# Patient Record
Sex: Female | Born: 1960 | Race: Black or African American | Hispanic: No
Health system: Southern US, Community
[De-identification: ages and names within clinical notes are randomized; demographics above are authoritative.]

## PROBLEM LIST (undated history)

## (undated) DIAGNOSIS — I10 Essential (primary) hypertension: Secondary | ICD-10-CM

## (undated) DIAGNOSIS — Z87828 Personal history of other (healed) physical injury and trauma: Secondary | ICD-10-CM

## (undated) DIAGNOSIS — Z91018 Allergy to other foods: Secondary | ICD-10-CM

## (undated) HISTORY — DX: Personal history of other (healed) physical injury and trauma: Z87.828

## (undated) HISTORY — DX: Allergy to other foods: Z91.018

## (undated) HISTORY — DX: Essential (primary) hypertension: I10

---

## 1989-03-29 HISTORY — PX: TUBAL LIGATION: SHX77

## 1992-03-29 HISTORY — PX: BREAST SURGERY: SHX581

## 1998-12-08 ENCOUNTER — Other Ambulatory Visit: Admission: RE | Admit: 1998-12-08 | Discharge: 1998-12-08 | Payer: Self-pay | Admitting: Gynecology

## 2002-07-03 ENCOUNTER — Other Ambulatory Visit: Admission: RE | Admit: 2002-07-03 | Discharge: 2002-07-03 | Payer: Self-pay | Admitting: Gynecology

## 2003-07-29 ENCOUNTER — Other Ambulatory Visit: Admission: RE | Admit: 2003-07-29 | Discharge: 2003-07-29 | Payer: Self-pay | Admitting: Gynecology

## 2004-08-17 ENCOUNTER — Other Ambulatory Visit: Admission: RE | Admit: 2004-08-17 | Discharge: 2004-08-17 | Payer: Self-pay | Admitting: Gynecology

## 2007-02-07 ENCOUNTER — Ambulatory Visit: Payer: Self-pay | Admitting: Internal Medicine

## 2007-02-07 DIAGNOSIS — I10 Essential (primary) hypertension: Secondary | ICD-10-CM

## 2007-02-20 ENCOUNTER — Ambulatory Visit: Payer: Self-pay | Admitting: Internal Medicine

## 2007-02-20 LAB — CONVERTED CEMR LAB
AST: 37 units/L (ref 0–37)
Albumin: 3.6 g/dL (ref 3.5–5.2)
Alkaline Phosphatase: 90 units/L (ref 39–117)
BUN: 13 mg/dL (ref 6–23)
Basophils Relative: 0.5 % (ref 0.0–1.0)
Calcium: 9.3 mg/dL (ref 8.4–10.5)
Chloride: 98 meq/L (ref 96–112)
Cholesterol: 202 mg/dL (ref 0–200)
Creatinine, Ser: 0.9 mg/dL (ref 0.4–1.2)
Crystals: NEGATIVE
Hemoglobin: 12.5 g/dL (ref 12.0–15.0)
Monocytes Absolute: 0.6 10*3/uL (ref 0.2–0.7)
Monocytes Relative: 12.7 % — ABNORMAL HIGH (ref 3.0–11.0)
Nitrite: NEGATIVE
RDW: 12.4 % (ref 11.5–14.6)
Specific Gravity, Urine: 1.015 (ref 1.000–1.03)
Total Bilirubin: 0.6 mg/dL (ref 0.3–1.2)
Total CHOL/HDL Ratio: 6.8
VLDL: 21 mg/dL (ref 0–40)

## 2007-08-07 ENCOUNTER — Ambulatory Visit: Payer: Self-pay | Admitting: Internal Medicine

## 2008-02-01 ENCOUNTER — Ambulatory Visit: Payer: Self-pay | Admitting: Internal Medicine

## 2008-02-05 ENCOUNTER — Ambulatory Visit: Payer: Self-pay | Admitting: Internal Medicine

## 2008-02-06 LAB — CONVERTED CEMR LAB
ALT: 23 units/L (ref 0–35)
AST: 31 units/L (ref 0–37)
Alkaline Phosphatase: 96 units/L (ref 39–117)
BUN: 11 mg/dL (ref 6–23)
CO2: 28 meq/L (ref 19–32)
Chloride: 103 meq/L (ref 96–112)
Creatinine, Ser: 0.9 mg/dL (ref 0.4–1.2)
Potassium: 3.4 meq/L — ABNORMAL LOW (ref 3.5–5.1)
TSH: 1.16 microintl units/mL (ref 0.35–5.50)
Total Bilirubin: 0.5 mg/dL (ref 0.3–1.2)

## 2008-02-26 ENCOUNTER — Telehealth: Payer: Self-pay | Admitting: Internal Medicine

## 2008-07-29 ENCOUNTER — Ambulatory Visit: Payer: Self-pay | Admitting: Internal Medicine

## 2008-07-29 LAB — CONVERTED CEMR LAB
ALT: 21 units/L (ref 0–35)
AST: 25 units/L (ref 0–37)
Albumin: 3.6 g/dL (ref 3.5–5.2)
BUN: 9 mg/dL (ref 6–23)
Basophils Relative: 0.2 % (ref 0.0–3.0)
Bilirubin Urine: NEGATIVE
Chloride: 105 meq/L (ref 96–112)
Eosinophils Relative: 5.8 % — ABNORMAL HIGH (ref 0.0–5.0)
GFR calc non Af Amer: 98.85 mL/min (ref >60)
HCT: 35.8 % — ABNORMAL LOW (ref 36.0–46.0)
Hemoglobin, Urine: NEGATIVE
Hemoglobin: 12.3 g/dL (ref 12.0–15.0)
Ketones, ur: NEGATIVE mg/dL
Leukocytes, UA: NEGATIVE
Lymphs Abs: 1.1 10*3/uL (ref 0.7–4.0)
MCV: 86.1 fL (ref 78.0–100.0)
Monocytes Absolute: 0.5 10*3/uL (ref 0.1–1.0)
Monocytes Relative: 9.7 % (ref 3.0–12.0)
Neutro Abs: 2.8 10*3/uL (ref 1.4–7.7)
Nitrite: NEGATIVE
Platelets: 245 10*3/uL (ref 150.0–400.0)
Potassium: 3.2 meq/L — ABNORMAL LOW (ref 3.5–5.1)
RBC: 4.16 M/uL (ref 3.87–5.11)
Sodium: 140 meq/L (ref 135–145)
TSH: 0.92 microintl units/mL (ref 0.35–5.50)
Total Protein: 7.5 g/dL (ref 6.0–8.3)
VLDL: 12.6 mg/dL (ref 0.0–40.0)
WBC: 4.7 10*3/uL (ref 4.5–10.5)
pH: 7 (ref 5.0–8.0)

## 2008-08-06 ENCOUNTER — Ambulatory Visit: Payer: Self-pay | Admitting: Internal Medicine

## 2008-08-06 DIAGNOSIS — E876 Hypokalemia: Secondary | ICD-10-CM

## 2008-08-06 DIAGNOSIS — L272 Dermatitis due to ingested food: Secondary | ICD-10-CM

## 2008-11-01 ENCOUNTER — Ambulatory Visit: Payer: Self-pay | Admitting: Internal Medicine

## 2008-11-01 LAB — CONVERTED CEMR LAB
BUN: 9 mg/dL (ref 6–23)
CO2: 27 meq/L (ref 19–32)
Calcium: 9.2 mg/dL (ref 8.4–10.5)
GFR calc non Af Amer: 98.74 mL/min (ref >60)
Glucose, Bld: 103 mg/dL — ABNORMAL HIGH (ref 70–99)
Sodium: 140 meq/L (ref 135–145)

## 2008-11-05 ENCOUNTER — Ambulatory Visit: Payer: Self-pay | Admitting: Internal Medicine

## 2009-03-29 DIAGNOSIS — Z87828 Personal history of other (healed) physical injury and trauma: Secondary | ICD-10-CM

## 2009-03-29 HISTORY — PX: KNEE ARTHROSCOPY: SUR90

## 2009-03-29 HISTORY — DX: Personal history of other (healed) physical injury and trauma: Z87.828

## 2009-05-13 ENCOUNTER — Ambulatory Visit: Payer: Self-pay | Admitting: Internal Medicine

## 2009-05-13 DIAGNOSIS — M25569 Pain in unspecified knee: Secondary | ICD-10-CM

## 2009-07-11 ENCOUNTER — Telehealth: Payer: Self-pay | Admitting: Internal Medicine

## 2009-11-06 ENCOUNTER — Ambulatory Visit: Payer: Self-pay | Admitting: Internal Medicine

## 2009-11-06 LAB — CONVERTED CEMR LAB
ALT: 18 units/L (ref 0–35)
AST: 21 units/L (ref 0–37)
Albumin: 3.9 g/dL (ref 3.5–5.2)
Alkaline Phosphatase: 66 units/L (ref 39–117)
BUN: 15 mg/dL (ref 6–23)
Basophils Relative: 0.3 % (ref 0.0–3.0)
Bilirubin Urine: NEGATIVE
Bilirubin, Direct: 0.1 mg/dL (ref 0.0–0.3)
Chloride: 107 meq/L (ref 96–112)
Cholesterol: 196 mg/dL (ref 0–200)
Creatinine, Ser: 0.9 mg/dL (ref 0.4–1.2)
Eosinophils Relative: 5 % (ref 0.0–5.0)
Glucose, Bld: 106 mg/dL — ABNORMAL HIGH (ref 70–99)
Hemoglobin, Urine: NEGATIVE
Hemoglobin: 12.1 g/dL (ref 12.0–15.0)
Ketones, ur: NEGATIVE mg/dL
LDL Cholesterol: 137 mg/dL — ABNORMAL HIGH (ref 0–99)
Lymphocytes Relative: 24 % (ref 12.0–46.0)
Monocytes Relative: 9.9 % (ref 3.0–12.0)
Neutro Abs: 3.2 10*3/uL (ref 1.4–7.7)
Neutrophils Relative %: 60.8 % (ref 43.0–77.0)
Potassium: 4.2 meq/L (ref 3.5–5.1)
RBC: 4.02 M/uL (ref 3.87–5.11)
Total Protein, Urine: NEGATIVE mg/dL
Total Protein: 7 g/dL (ref 6.0–8.3)
Urine Glucose: NEGATIVE mg/dL
VLDL: 15.8 mg/dL (ref 0.0–40.0)
WBC: 5.2 10*3/uL (ref 4.5–10.5)

## 2009-11-12 ENCOUNTER — Encounter: Payer: Self-pay | Admitting: Internal Medicine

## 2009-11-12 ENCOUNTER — Ambulatory Visit: Payer: Self-pay | Admitting: Internal Medicine

## 2009-11-26 ENCOUNTER — Encounter: Payer: Self-pay | Admitting: Internal Medicine

## 2009-12-02 ENCOUNTER — Encounter: Payer: Self-pay | Admitting: Internal Medicine

## 2010-04-28 NOTE — Assessment & Plan Note (Signed)
Summary: 6 mos f/u $50/cd   Vital Signs:  Patient profile:   50 year old female Weight:      196 pounds BMI:     34.85 Temp:     98.8 degrees F oral Pulse rate:   75 / minute BP sitting:   156 / 100  (left arm)  Vitals Entered By: Tora Perches (May 13, 2009 9:41 AM) CC: f/u Is Patient Diabetic? No   CC:  f/u.  History of Present Illness: The patient presents for a follow up of hypertension, food allergies C/o L knee pain x 2 wks   Preventive Screening-Counseling & Management  Alcohol-Tobacco     Smoking Status: never  Current Medications (verified): 1)  Spironolactone 25 Mg Tabs (Spironolactone) .Marland Kitchen.. 1 By Mouth Qd 2)  Amlodipine Besylate 5 Mg  Tabs (Amlodipine Besylate) .Marland Kitchen.. 1 By Mouth Every Day 3)  Vitamin D3 1000 Unit  Tabs (Cholecalciferol) .Marland Kitchen.. 1 By Mouth Daily 4)  Sudafed 30 Mg Tabs (Pseudoephedrine Hcl) .... 2 By Mouth As Needed Allergy 5)  Complete Allergy 25 Mg Tabs (Diphenhydramine Hcl) .... 2 By Mouth Once Daily As Needed Allergy 6)  Aspir-Low 81 Mg Tbec (Aspirin) .Marland Kitchen.. 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   Nose:  External nasal examination shows no deformity or inflammation. Nasal mucosa are pink and moist without lesions or exudates. Mouth:  Oral mucosa and oropharynx without lesions or exudates.  Teeth in good repair. Lungs:  Normal respiratory effort, chest expands symmetrically. Lungs are clear to auscultation, no crackles or wheezes. Heart:  Normal rate and regular  rhythm. S1 and S2 normal without gallop, murmur, click, rub or other extra sounds. Abdomen:  Bowel sounds positive,abdomen soft and non-tender without masses, organomegaly or hernias noted. Msk:  L knee crackles, NT Extremities:  No clubbing, cyanosis, edema, or deformity noted with normal full range of motion of all joints.   Neurologic:  No cranial nerve deficits noted. Station and gait are normal. Plantar reflexes are down-going bilaterally. DTRs are symmetrical throughout. Sensory, motor and coordinative functions appear intact. Skin:  Intact without suspicious lesions or rashes   Impression & Recommendations:  Problem # 1:  KNEE PAIN (ICD-719.46) L  Her updated medication list for this problem includes:    Aspir-low 81 Mg Tbec (Aspirin) .Marland Kitchen... 1 once daily pc    Naproxen 500 Mg Tabs (Naproxen) .Marland Kitchen... 1 by mouth two times a day pc for pain/arthritis  Problem # 2:  HYPERTENSION (ICD-401.9) Assessment: Deteriorated Risks of noncompliance with treatment discussed. Compliance encouraged.  Her updated medication list for this problem includes:    Spironolactone 25 Mg Tabs (Spironolactone) .Marland Kitchen... 1 by mouth qd    Amlodipine Besylate 5 Mg Tabs (Amlodipine besylate) .Marland Kitchen... 1 by mouth every day  - restart Stay off work today, to work 2/16  Problem # 3:  ALLERGY, FOOD (ICD-693.1) Assessment: Improved Risks of noncompliance with diet discussed. Compliance encouraged.   Complete Medication List: 1)  Spironolactone 25 Mg Tabs (Spironolactone) .Marland Kitchen.. 1 by mouth qd 2)  Amlodipine Besylate 5 Mg Tabs (Amlodipine besylate) .Marland KitchenMarland KitchenMarland Kitchen  1 by mouth every day 3)  Vitamin D3 1000 Unit Tabs (Cholecalciferol) .Marland Kitchen.. 1 by mouth daily 4)  Sudafed 30 Mg Tabs (Pseudoephedrine hcl) .... 2 by mouth as needed allergy 5)  Complete Allergy 25 Mg Tabs (Diphenhydramine hcl) .... 2 by mouth once daily as needed allergy 6)  Aspir-low 81 Mg Tbec (Aspirin) .Marland Kitchen.. 1 once daily pc 7)  Naproxen 500 Mg Tabs (Naproxen) .Marland Kitchen.. 1 by mouth two  times a day pc for pain/arthritis  Patient Instructions: 1)  Please schedule a follow-up appointment in 6 months well w/labs. 2)  Call if you are not better in a reasonable amount of time or if worse.  Prescriptions: AMLODIPINE BESYLATE 5 MG  TABS (AMLODIPINE BESYLATE) 1 by mouth every day  #90 x 3   Entered and Authorized by:   Tresa Garter MD   Signed by:   Tresa Garter MD on 05/13/2009   Method used:   Electronically to        [facility]  [address] [phone]* (retail)       [address]       [place], [place]  [address]       Ph: [phone]       Fax: [phone]   RxID:   [id number] SPIRONOLACTONE 25 MG TABS (SPIRONOLACTONE) 1 by mouth qd  #90 x 3   Entered and Authorized by:   Tresa Garter MD   Signed by:   Tresa Garter MD on 05/13/2009   Method used:   Electronically to        [facility]  [address] [phone]* (retail)       [address]       [place], [place]  [address]       Ph: [phone]       Fax: [phone]   RxID:   [id number] NAPROXEN 500 MG TABS (NAPROXEN) 1 by mouth two times a day pc for pain/arthritis  #60 x 3   Entered and Authorized by:   Tresa Garter MD   Signed by:   Tresa Garter MD on 05/13/2009   Method used:   [facility]ically to        [facility]  [address] [phone]* (retail)       [address]       [place], [place]  [address]       Ph: [phone]       Fax: [phone]   RxID:   [id number]

## 2010-04-28 NOTE — Assessment & Plan Note (Signed)
Summary: CPX /NWS  #   Vital Signs:  Patient profile:   50 year old female Height:      63 inches Weight:      199 pounds BMI:     35.38 Temp:     98.3 degrees F oral Pulse rate:   86 / minute Pulse rhythm:   regular Resp:     16 per minute BP sitting:   130 / 88  (left arm) Cuff size:   large  Vitals Entered By: Lanier Prude, Beverly Gust) (November 12, 2009 8:26 AM) CC: CPX Is Patient Diabetic? No   CC:  CPX.  History of Present Illness: The patient presents for a preventive health examination    Current Medications (verified): 1)  Spironolactone 25 Mg Tabs (Spironolactone) .Marland Kitchen.. 1 By Mouth Qd 2)  Amlodipine Besylate 5 Mg  Tabs (Amlodipine Besylate) .Marland Kitchen.. 1 By Mouth Every Day 3)  Vitamin D3 1000 Unit  Tabs (Cholecalciferol) .Marland Kitchen.. 1 By Mouth Daily 4)  Sudafed 30 Mg Tabs (Pseudoephedrine Hcl) .... 2 By Mouth As Needed Allergy 5)  Complete Allergy 25 Mg Tabs (Diphenhydramine Hcl) .... 2 By Mouth Once Daily As Needed Allergy 6)  Aspir-Low 81 Mg Tbec (Aspirin) .Marland Kitchen.. 1 Once Daily Pc 7)  Naproxen 500 Mg Tabs (Naproxen) .Marland Kitchen.. 1 By Mouth Two Times A Day Pc For Pain/arthritis  Allergies (verified): 1)  * Peanuts, Wheat, Tomato  Past History:  Past Surgical History: Last updated: 02/07/2007 Breast reduction 1994 Tubal lig.  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  The patient denies anorexia, fever, weight loss, vision loss, decreased hearing, hoarseness, chest pain, syncope, dyspnea on exertion,  peripheral edema, prolonged cough, headaches, hemoptysis, abdominal pain, melena, hematochezia, severe indigestion/heartburn, hematuria, incontinence, genital sores, muscle weakness, suspicious skin lesions, transient blindness, depression, unusual weight change, abnormal bleeding, enlarged lymph nodes, angioedema, and breast masses.    Physical Exam  General:  overweight-appearing.   Head:  Normocephalic and atraumatic without obvious abnormalities. No apparent alopecia or balding. Eyes:  No corneal or conjunctival inflammation noted. EOMI. Perrla. Ears:  External ear exam shows no significant lesions or deformities.  Otoscopic examination reveals clear canals, tympanic membranes are intact bilaterally without bulging, retraction, inflammation or discharge. Hearing is grossly normal bilaterally. Nose:  External nasal examination shows no deformity or inflammation. Nasal mucosa are pink and moist without lesions or exudates. Mouth:  Oral mucosa and oropharynx without lesions or exudates.  Teeth in good repair. Neck:  No deformities, masses, or tenderness noted. Chest Wall:  No deformities, masses, or tenderness noted. Lungs:  Normal respiratory effort, chest expands symmetrically. Lungs are clear to auscultation, no crackles or wheezes. Heart:  Normal rate and regular rhythm. S1 and S2 normal without gallop, murmur, click, rub or other extra sounds. Abdomen:  Bowel sounds positive,abdomen soft and non-tender without masses, organomegaly or hernias noted. Msk:  L medial knee is tender Pulses:  R and L carotid,radial,femoral,dorsalis pedis and posterior tibial pulses are full and equal bilaterally Extremities:  No clubbing, cyanosis, edema, or deformity noted with normal full  range of motion of all joints.   Neurologic:  No cranial nerve deficits noted. Station and gait are normal. Plantar reflexes are down-going bilaterally. DTRs are symmetrical throughout. Sensory, motor and coordinative functions  appear intact. Skin:  Intact without suspicious lesions or rashes Cervical Nodes:  No lymphadenopathy noted Inguinal Nodes:  No significant adenopathy Psych:  Cognition and judgment appear intact. Alert and cooperative with normal attention span and concentration. No apparent delusions, illusions, hallucinations   Impression & Recommendations:  Problem # 1:  ROUTINE GENERAL MEDICAL EXAM@HEALTH  CARE FACL (ICD-V70.0) Assessment New PAP, mammo up to date Health and age related issues were discussed. Available screening tests and vaccinations were discussed as well. Healthy life style including good diet and exercise was discussed.  The labs were reviewed with the patient.   Orders: EKG w/ Interpretation (93000) OK  Problem # 2:  KNEE PAIN (ICD-719.46) L Assessment: New Surgery is pending w/ Dr Luiz Blare Her updated medication list for this problem includes:    Aspir-low 81 Mg Tbec (Aspirin) .Marland Kitchen... 1 once daily pc    Naproxen 500 Mg Tabs (Naproxen) .Marland Kitchen... 1 by mouth two times a day pc for pain/arthritis  Problem # 3:  ALLERGY, FOOD (ICD-693.1) Assessment: Improved  Complete Medication List: 1)  Spironolactone 25 Mg Tabs (Spironolactone) .Marland Kitchen.. 1 by mouth qd 2)  Amlodipine Besylate 5 Mg Tabs (Amlodipine besylate) .Marland Kitchen.. 1 by mouth every day 3)  Vitamin D3 1000 Unit Tabs (Cholecalciferol) .Marland Kitchen.. 1 by mouth daily 4)  Sudafed 30 Mg Tabs (Pseudoephedrine hcl) .... 2 by mouth as needed allergy 5)  Complete Allergy 25 Mg Tabs (Diphenhydramine hcl) .... 2 by mouth once daily as needed allergy 6)  Aspir-low 81 Mg Tbec (Aspirin) .Marland Kitchen.. 1 once daily pc 7)  Naproxen 500 Mg Tabs (Naproxen) .Marland Kitchen.. 1 by mouth two times a day pc for pain/arthritis  Patient Instructions: 1)  Please schedule a follow-up appointment in 6 months. 2)  BMP prior to visit, ICD-9: 3)  HbgA1C prior to visit, ICD-9: 995.20 790.29 4)  Try Miralax  Contraindications/Deferment of Procedures/Staging:    Test/Procedure: DPT vaccine     Reason for deferment: declined

## 2010-04-28 NOTE — Letter (Signed)
Summary: [facility]  [facility]   Imported By: Lennie Odor 12/12/2009 16:55:03  _____________________________________________________________________  External Attachment:    Type:   Image     Comment:   External Document

## 2010-04-28 NOTE — Progress Notes (Signed)
Summary: REFERRAL  Phone Note Call from Patient   Summary of Call: Pt continues to c/o knee pain and would like referral.  Initial call taken by: Lamar Sprinkles, CMA,  July 11, 2009 9:19 AM  Follow-up for Phone Call        OK Follow-up by: Tresa Garter MD,  July 11, 2009 1:12 PM

## 2010-04-28 NOTE — Op Note (Signed)
Summary: [facility]  [facility]   Imported By: Lennie Odor 12/12/2009 16:55:50  _____________________________________________________________________  External Attachment:    Type:   Image     Comment:   External Document

## 2010-05-01 ENCOUNTER — Telehealth: Payer: Self-pay | Admitting: Internal Medicine

## 2010-05-11 ENCOUNTER — Other Ambulatory Visit: Payer: Self-pay

## 2010-05-12 ENCOUNTER — Other Ambulatory Visit: Payer: Self-pay | Admitting: Internal Medicine

## 2010-05-12 ENCOUNTER — Encounter (INDEPENDENT_AMBULATORY_CARE_PROVIDER_SITE_OTHER): Payer: Self-pay | Admitting: *Deleted

## 2010-05-12 ENCOUNTER — Other Ambulatory Visit: Payer: BC Managed Care – PPO

## 2010-05-12 DIAGNOSIS — R7309 Other abnormal glucose: Secondary | ICD-10-CM

## 2010-05-12 DIAGNOSIS — T887XXA Unspecified adverse effect of drug or medicament, initial encounter: Secondary | ICD-10-CM

## 2010-05-12 LAB — BASIC METABOLIC PANEL
Calcium: 9.3 mg/dL (ref 8.4–10.5)
GFR: 122.49 mL/min (ref >60.00)
Glucose, Bld: 92 mg/dL (ref 70–99)
Potassium: 3.9 mEq/L (ref 3.5–5.1)
Sodium: 138 mEq/L (ref 135–145)

## 2010-05-14 NOTE — Progress Notes (Signed)
Summary: MED ?  Phone Note Call from Patient Call back at [facility] Phone [phone]   Summary of Call: Pt left vm ? problem w/med. left mess to call office back. Initial call taken by: Lamar Sprinkles, CMA,  May 01, 2010 6:33 PM  Follow-up for Phone Call        Spoke w/pt She c/o itching all over for 2 days - better now. She has been taking benadryl. She will come in for earlier apt for eval if symptoms return.  Follow-up by: Lamar Sprinkles, CMA,  May 04, 2010 4:05 PM  Additional Follow-up for Phone Call Additional follow up Details #1::        Adree w/OV if problems Thank you!  Additional Follow-up by: Tresa Garter MD,  May 04, 2010 4:40 PM

## 2010-05-18 ENCOUNTER — Ambulatory Visit: Payer: Self-pay | Admitting: Internal Medicine

## 2010-06-24 ENCOUNTER — Ambulatory Visit: Payer: Self-pay | Admitting: Internal Medicine

## 2010-07-09 ENCOUNTER — Ambulatory Visit (INDEPENDENT_AMBULATORY_CARE_PROVIDER_SITE_OTHER): Payer: BC Managed Care – PPO | Admitting: Internal Medicine

## 2010-07-09 ENCOUNTER — Encounter: Payer: Self-pay | Admitting: Internal Medicine

## 2010-07-09 VITALS — BP 140/90 | HR 84 | Temp 98.8°F | Resp 16 | Ht 64.0 in | Wt 196.0 lb

## 2010-07-09 DIAGNOSIS — M25569 Pain in unspecified knee: Secondary | ICD-10-CM

## 2010-07-09 DIAGNOSIS — L272 Dermatitis due to ingested food: Secondary | ICD-10-CM

## 2010-07-09 DIAGNOSIS — I10 Essential (primary) hypertension: Secondary | ICD-10-CM

## 2010-07-09 DIAGNOSIS — E876 Hypokalemia: Secondary | ICD-10-CM

## 2010-07-09 MED ORDER — SPIRONOLACTONE 50 MG PO TABS: 50.0000 mg | ORAL_TABLET | Freq: Every day | ORAL | Status: DC

## 2010-07-09 NOTE — Assessment & Plan Note (Signed)
Better  

## 2010-07-09 NOTE — Assessment & Plan Note (Signed)
Check BMET 

## 2010-07-09 NOTE — Patient Instructions (Signed)
Try Gluten-free diet Normal BP 130/85 or less

## 2010-07-09 NOTE — Assessment & Plan Note (Signed)
We increased Spironolactone

## 2010-07-09 NOTE — Assessment & Plan Note (Signed)
Cont Rx. She will try gluten free diet

## 2010-07-09 NOTE — Progress Notes (Signed)
  Subjective:    Patient ID: Allison Harrison, female    DOB: Apr 30, 1960, 50 y.o.   MRN: [id number]  HPI  The patient presents for a follow-up of  chronic hypertension, food allergies, knee meniscal tear L controlled with medicines    Review of Systems  Constitutional: Negative for diaphoresis, activity change and fatigue.  HENT: Negative for tinnitus.   Eyes: Negative for redness.  Respiratory: Negative for chest tightness and shortness of breath.   Cardiovascular: Negative for leg swelling.  Genitourinary: Negative for dysuria and pelvic pain.  Musculoskeletal: Negative for back pain.  Neurological: Negative for seizures and facial asymmetry.  Psychiatric/Behavioral: Negative for confusion.   Wt Readings from Last 3 Encounters:  07/09/10 196 lb (88.905 kg)  11/12/09 199 lb (90.266 kg)  05/13/09 196 lb (88.905 kg)        Objective:   Physical Exam  Constitutional: No distress.  Neck: No JVD present. No thyromegaly present.  Cardiovascular:  No murmur heard. Pulmonary/Chest: She has no rales.  Abdominal: She exhibits no mass.  Neurological: No cranial nerve deficit.  Skin: No erythema.          Assessment & Plan:

## 2011-01-08 ENCOUNTER — Ambulatory Visit (INDEPENDENT_AMBULATORY_CARE_PROVIDER_SITE_OTHER): Payer: BC Managed Care – PPO | Admitting: Internal Medicine

## 2011-01-08 ENCOUNTER — Encounter: Payer: Self-pay | Admitting: Internal Medicine

## 2011-01-08 VITALS — BP 150/90 | HR 76 | Temp 99.0°F | Resp 16 | Wt 191.0 lb

## 2011-01-08 DIAGNOSIS — R739 Hyperglycemia, unspecified: Secondary | ICD-10-CM

## 2011-01-08 DIAGNOSIS — R7309 Other abnormal glucose: Secondary | ICD-10-CM

## 2011-01-08 DIAGNOSIS — M25569 Pain in unspecified knee: Secondary | ICD-10-CM

## 2011-01-08 DIAGNOSIS — E876 Hypokalemia: Secondary | ICD-10-CM

## 2011-01-08 DIAGNOSIS — I10 Essential (primary) hypertension: Secondary | ICD-10-CM

## 2011-01-08 MED ORDER — SPIRONOLACTONE 50 MG PO TABS: 50.0000 mg | ORAL_TABLET | Freq: Every day | ORAL | Status: DC

## 2011-01-08 MED ORDER — NAPROXEN 500 MG PO TABS: 500.0000 mg | ORAL_TABLET | Freq: Two times a day (BID) | ORAL | Status: DC

## 2011-01-08 MED ORDER — AMLODIPINE BESYLATE 10 MG PO TABS: 10.0000 mg | ORAL_TABLET | Freq: Every day | ORAL | Status: DC

## 2011-01-08 NOTE — Assessment & Plan Note (Signed)
Continue with current prescription therapy as reflected on the Med list.  

## 2011-01-08 NOTE — Progress Notes (Signed)
  Subjective:    Patient ID: Allison Harrison, female    DOB: 1960-12-11, 50 y.o.   MRN: [id number]  HPI  The patient presents for a follow-up of  chronic hypertension, not too well controlled with medicines, f/u elev CBG and knee pain    Review of Systems  Constitutional: Negative for chills, activity change, appetite change, fatigue and unexpected weight change.  HENT: Negative for congestion, mouth sores and sinus pressure.   Eyes: Negative for visual disturbance.  Respiratory: Negative for cough and chest tightness.   Gastrointestinal: Negative for nausea and abdominal pain.  Genitourinary: Negative for frequency, difficulty urinating and vaginal pain.  Musculoskeletal: Positive for arthralgias (knee). Negative for back pain and gait problem.  Skin: Negative for pallor and rash.  Neurological: Negative for dizziness, tremors, weakness, numbness and headaches.  Psychiatric/Behavioral: Negative for confusion and sleep disturbance.   BP Readings from Last 3 Encounters:  01/08/11 150/90  07/09/10 140/90  11/12/09 130/88   Wt Readings from Last 3 Encounters:  01/08/11 191 lb (86.637 kg)  07/09/10 196 lb (88.905 kg)  11/12/09 199 lb (90.266 kg)        Objective:   Physical Exam  Constitutional: She appears well-developed. No distress.       obese  HENT:  Head: Normocephalic.  Right Ear: External ear normal.  Left Ear: External ear normal.  Nose: Nose normal.  Mouth/Throat: Oropharynx is clear and moist.  Eyes: Conjunctivae are normal. Pupils are equal, round, and reactive to light. Right eye exhibits no discharge. Left eye exhibits no discharge.  Neck: Normal range of motion. Neck supple. No JVD present. No tracheal deviation present. No thyromegaly present.  Cardiovascular: Normal rate, regular rhythm and normal heart sounds.   Pulmonary/Chest: No stridor. No respiratory distress. She has no wheezes.  Abdominal: Soft. Bowel sounds are normal. She exhibits no distension  and no mass. There is no tenderness. There is no rebound and no guarding.  Musculoskeletal: She exhibits no edema and no tenderness.  Lymphadenopathy:    She has no cervical adenopathy.  Neurological: She displays normal reflexes. No cranial nerve deficit. She exhibits normal muscle tone. Coordination normal.  Skin: No rash noted. No erythema.  Psychiatric: She has a normal mood and affect. Her behavior is normal. Judgment and thought content normal.          Assessment & Plan:   Labs ordered

## 2011-01-08 NOTE — Assessment & Plan Note (Signed)
Continue with current prescription therapy as reflected on the Med list. prn 

## 2011-01-08 NOTE — Assessment & Plan Note (Signed)
Check labs 

## 2011-01-08 NOTE — Patient Instructions (Signed)
BP Readings from Last 3 Encounters:  01/08/11 150/90  07/09/10 140/90  11/12/09 130/88   Wt Readings from Last 3 Encounters:  01/08/11 191 lb (86.637 kg)  07/09/10 196 lb (88.905 kg)  11/12/09 199 lb (90.266 kg)

## 2011-05-07 ENCOUNTER — Other Ambulatory Visit: Payer: Self-pay | Admitting: *Deleted

## 2011-05-07 MED ORDER — AMLODIPINE BESYLATE 10 MG PO TABS: 10.0000 mg | ORAL_TABLET | Freq: Every day | ORAL | Status: DC

## 2011-07-05 ENCOUNTER — Other Ambulatory Visit (INDEPENDENT_AMBULATORY_CARE_PROVIDER_SITE_OTHER): Payer: BC Managed Care – PPO

## 2011-07-05 DIAGNOSIS — R739 Hyperglycemia, unspecified: Secondary | ICD-10-CM

## 2011-07-05 DIAGNOSIS — R7309 Other abnormal glucose: Secondary | ICD-10-CM

## 2011-07-05 DIAGNOSIS — E876 Hypokalemia: Secondary | ICD-10-CM

## 2011-07-05 DIAGNOSIS — M25569 Pain in unspecified knee: Secondary | ICD-10-CM

## 2011-07-05 DIAGNOSIS — I10 Essential (primary) hypertension: Secondary | ICD-10-CM

## 2011-07-05 LAB — BASIC METABOLIC PANEL
BUN: 12 mg/dL (ref 6–23)
CO2: 26 mEq/L (ref 19–32)
Glucose, Bld: 94 mg/dL (ref 70–99)
Potassium: 4.1 mEq/L (ref 3.5–5.1)
Sodium: 139 mEq/L (ref 135–145)

## 2011-07-12 ENCOUNTER — Encounter: Payer: Self-pay | Admitting: Internal Medicine

## 2011-07-12 ENCOUNTER — Ambulatory Visit (INDEPENDENT_AMBULATORY_CARE_PROVIDER_SITE_OTHER): Payer: BC Managed Care – PPO | Admitting: Internal Medicine

## 2011-07-12 VITALS — BP 158/80 | HR 80 | Temp 98.2°F | Resp 16 | Wt 189.0 lb

## 2011-07-12 DIAGNOSIS — R739 Hyperglycemia, unspecified: Secondary | ICD-10-CM

## 2011-07-12 DIAGNOSIS — I10 Essential (primary) hypertension: Secondary | ICD-10-CM

## 2011-07-12 DIAGNOSIS — M6530 Trigger finger, unspecified finger: Secondary | ICD-10-CM

## 2011-07-12 DIAGNOSIS — M65311 Trigger thumb, right thumb: Secondary | ICD-10-CM | POA: Insufficient documentation

## 2011-07-12 DIAGNOSIS — R7309 Other abnormal glucose: Secondary | ICD-10-CM

## 2011-07-12 DIAGNOSIS — E876 Hypokalemia: Secondary | ICD-10-CM

## 2011-07-12 MED ORDER — NAPROXEN 500 MG PO TABS: 500.0000 mg | ORAL_TABLET | Freq: Two times a day (BID) | ORAL | Status: DC

## 2011-07-12 NOTE — Assessment & Plan Note (Signed)
resolved 

## 2011-07-12 NOTE — Assessment & Plan Note (Signed)
Continue with current prescription therapy as reflected on the Med list. Chronic States BP is nl at home

## 2011-07-12 NOTE — Assessment & Plan Note (Signed)
No DM

## 2011-07-12 NOTE — Progress Notes (Signed)
Patient ID: Allison Harrison, female   DOB: April 23, 1960, 51 y.o.   MRN: [id number]  Subjective:    Patient ID: Allison Harrison, female    DOB: 03/30/1960, 51 y.o.   MRN: [id number]  HPI  The patient presents for a follow-up of  chronic hypertension, not too well controlled with medicines, f/u elev CBG and knee pain. C/o R thumb triggering x mo    Review of Systems  Constitutional: Negative for chills, activity change, appetite change, fatigue and unexpected weight change.  HENT: Negative for congestion, mouth sores and sinus pressure.   Eyes: Negative for visual disturbance.  Respiratory: Negative for cough and chest tightness.   Gastrointestinal: Negative for nausea and abdominal pain.  Genitourinary: Negative for frequency, difficulty urinating and vaginal pain.  Musculoskeletal: Positive for arthralgias (knee). Negative for back pain and gait problem.  Skin: Negative for pallor and rash.  Neurological: Negative for dizziness, tremors, weakness, numbness and headaches.  Psychiatric/Behavioral: Negative for confusion and sleep disturbance.   BP Readings from Last 3 Encounters:  07/12/11 158/80  01/08/11 150/90  07/09/10 140/90   Wt Readings from Last 3 Encounters:  07/12/11 189 lb (85.73 kg)  01/08/11 191 lb (86.637 kg)  07/09/10 196 lb (88.905 kg)        Objective:   Physical Exam  Constitutional: She appears well-developed. No distress.       obese  HENT:  Head: Normocephalic.  Right Ear: External ear normal.  Left Ear: External ear normal.  Nose: Nose normal.  Mouth/Throat: Oropharynx is clear and moist.  Eyes: Conjunctivae are normal. Pupils are equal, round, and reactive to light. Right eye exhibits no discharge. Left eye exhibits no discharge.  Neck: Normal range of motion. Neck supple. No JVD present. No tracheal deviation present. No thyromegaly present.  Cardiovascular: Normal rate, regular rhythm and normal heart sounds.   Pulmonary/Chest: No stridor. No  respiratory distress. She has no wheezes.  Abdominal: Soft. Bowel sounds are normal. She exhibits no distension and no mass. There is no tenderness. There is no rebound and no guarding.  Musculoskeletal: She exhibits no edema and no tenderness.  Lymphadenopathy:    She has no cervical adenopathy.  Neurological: She displays normal reflexes. No cranial nerve deficit. She exhibits normal muscle tone. Coordination normal.  Skin: No rash noted. No erythema.  Psychiatric: She has a normal mood and affect. Her behavior is normal. Judgment and thought content normal.  R thumb with a little bit of pain over palmar the MCP   Lab Results  Component Value Date   WBC 5.2 11/06/2009   HGB 12.1 11/06/2009   HCT 35.7* 11/06/2009   PLT 250.0 11/06/2009   GLUCOSE 94 07/05/2011   CHOL 196 11/06/2009   TRIG 79.0 11/06/2009   HDL 42.90 11/06/2009   LDLDIRECT 147.1 07/29/2008   LDLCALC 137* 11/06/2009   ALT 18 11/06/2009   AST 21 11/06/2009   NA 139 07/05/2011   K 4.1 07/05/2011   CL 105 07/05/2011   CREATININE 0.9 07/05/2011   BUN 12 07/05/2011   CO2 26 07/05/2011   TSH 1.34 11/06/2009   HGBA1C 6.4 07/05/2011        Assessment & Plan:   Labs ordered

## 2011-07-12 NOTE — Assessment & Plan Note (Signed)
Start Naproxen Will inject if not better

## 2011-07-23 ENCOUNTER — Telehealth: Payer: Self-pay

## 2011-07-23 DIAGNOSIS — Z0000 Encounter for general adult medical examination without abnormal findings: Secondary | ICD-10-CM

## 2011-07-23 NOTE — Telephone Encounter (Signed)
Physical labs entered 

## 2011-07-25 ENCOUNTER — Other Ambulatory Visit: Payer: Self-pay | Admitting: Internal Medicine

## 2011-09-30 ENCOUNTER — Emergency Department (HOSPITAL_COMMUNITY): Payer: BC Managed Care – PPO

## 2011-09-30 ENCOUNTER — Emergency Department (HOSPITAL_COMMUNITY)
Admission: EM | Admit: 2011-09-30 | Discharge: 2011-09-30 | Disposition: A | Discharge status: Home / Self Care | Payer: BC Managed Care – PPO | Attending: Emergency Medicine | Admitting: Emergency Medicine

## 2011-09-30 ENCOUNTER — Encounter (HOSPITAL_COMMUNITY): Payer: Self-pay | Admitting: *Deleted

## 2011-09-30 DIAGNOSIS — I10 Essential (primary) hypertension: Secondary | ICD-10-CM | POA: Insufficient documentation

## 2011-09-30 DIAGNOSIS — M79644 Pain in right finger(s): Secondary | ICD-10-CM

## 2011-09-30 DIAGNOSIS — M25562 Pain in left knee: Secondary | ICD-10-CM

## 2011-09-30 DIAGNOSIS — M25569 Pain in unspecified knee: Secondary | ICD-10-CM | POA: Insufficient documentation

## 2011-09-30 NOTE — ED Provider Notes (Signed)
History     CSN: [id number]  Arrival date & time 09/30/11  7829   First MD Initiated Contact with Patient 09/30/11 [phone]      Chief Complaint  Patient presents with  . Fall  . Knee Pain  . Hand Pain    (Consider location/radiation/quality/duration/timing/severity/associated sxs/prior treatment) Patient is a 51 y.o. female presenting with knee pain. The history is provided by the patient.  Knee Pain This is a new problem. The current episode started yesterday. The problem occurs constantly. The problem has been unchanged. Pertinent negatives include no abdominal pain, chest pain, chills, congestion, coughing, diaphoresis, fatigue, fever, headaches, nausea, neck pain, numbness, rash, sore throat or vomiting. The symptoms are aggravated by standing (walking upstairs). She has tried nothing for the symptoms. The treatment provided no relief.    Past Medical History  Diagnosis Date  . Hypertension   . Food allergy     wheat  . History of meniscal tear 2011    Left knee-Dr. Luiz Blare    Past Surgical History  Procedure Date  . Breast surgery 1994    Reduction  . Tubal ligation   . Knee arthroscopy     Left    Family History  Problem Relation Age of Onset  . Diabetes Other   . Hypertension Other   . Asthma Other   . Hypertension Mother   . Diabetes Mother   . Heart disease Father     CHF    History  Substance Use Topics  . Smoking status: Never Smoker   . Smokeless tobacco: Not on file  . Alcohol Use: No    OB History    Grav Para Term Preterm Abortions TAB SAB Ect Mult Living                  Review of Systems  Constitutional: Negative for fever, chills, diaphoresis and fatigue.  HENT: Negative for ear pain, congestion, sore throat, facial swelling, mouth sores, trouble swallowing, neck pain and neck stiffness.   Eyes: Negative.   Respiratory: Negative for apnea, cough, chest tightness, shortness of breath and wheezing.   Cardiovascular: Negative for chest  pain, palpitations and leg swelling.  Gastrointestinal: Negative for nausea, vomiting, abdominal pain, diarrhea and abdominal distention.  Genitourinary: Negative for hematuria, flank pain, vaginal discharge, difficulty urinating and menstrual problem.  Musculoskeletal: Negative for back pain and gait problem.  Skin: Negative for rash and wound.  Neurological: Negative for dizziness, tremors, seizures, syncope, facial asymmetry, numbness and headaches.  Psychiatric/Behavioral: Negative.   All other systems reviewed and are negative.    Allergies  Review of patient's allergies indicates no known allergies.  Home Medications   Current Outpatient Rx  Name Route Sig Dispense Refill  . ASPIRIN 81 MG PO TBEC Oral Take 81 mg by mouth daily. With meals.     Marland Kitchen VITAMIN D3 1000 UNITS PO TABS Oral Take 1,000 Units by mouth daily.      Marland Kitchen DIPHENHYDRAMINE HCL 25 MG PO TABS Oral Take 50 mg by mouth daily as needed. For allergies      . NAPROXEN 500 MG PO TABS Oral Take 1 tablet (500 mg total) by mouth 2 (two) times daily with a meal. For pain/arthritis. 60 tablet 2  . PSEUDOEPHEDRINE HCL 30 MG PO TABS Oral Take 60 mg by mouth as needed. For allergies        BP 150/89  Pulse 82  Temp 98.4 F (36.9 C) (Oral)  Resp 18  SpO2 100%  Physical Exam  Nursing note and vitals reviewed. Constitutional: She is oriented to person, place, and time. She appears well-developed and well-nourished. No distress.  HENT:  Head: Normocephalic and atraumatic.  Right Ear: External ear normal.  Left Ear: External ear normal.  Nose: Nose normal.  Mouth/Throat: Oropharynx is clear and moist. No oropharyngeal exudate.  Eyes: Conjunctivae and EOM are normal. Pupils are equal, round, and reactive to light. Right eye exhibits no discharge. Left eye exhibits no discharge.  Neck: Normal range of motion. Neck supple. No JVD present. No tracheal deviation present. No thyromegaly present.  Cardiovascular: Normal rate,  regular rhythm, normal heart sounds and intact distal pulses.  Exam reveals no gallop and no friction rub.   No murmur heard. Pulmonary/Chest: Effort normal and breath sounds normal. No respiratory distress. She has no wheezes. She has no rales. She exhibits no tenderness.  Abdominal: Soft. Bowel sounds are normal. She exhibits no distension. There is no tenderness. There is no rebound and no guarding.  Musculoskeletal: Normal range of motion.       Left knee: She exhibits normal range of motion, no swelling, no effusion, no ecchymosis, no deformity, no laceration, no erythema, normal alignment, no LCL laxity and normal patellar mobility. no tenderness found.       Right hand: She exhibits bony tenderness (mild bony tenderness of the the MCP joint in the right thumb). She exhibits normal range of motion and no tenderness.  Lymphadenopathy:    She has no cervical adenopathy.  Neurological: She is alert and oriented to person, place, and time. No cranial nerve deficit. Coordination normal.  Skin: Skin is warm. No rash noted. She is not diaphoretic.  Psychiatric: She has a normal mood and affect. Her behavior is normal. Judgment and thought content normal.    ED Course  Procedures (including critical care time)  Labs Reviewed - No data to display No results found.   No diagnosis found.    MDM  51 year old female patient with past medical history left knee arthroscopic presents with left knee pain after falling at a store after slipping on water. Patient also complains of right thumb pain at the MCP joint. Fall happened yesterday. Patient has been ambulatory since. Patient says she has minimal pain with ambulation but says she has more significant pain with standing or walking up stairs. Exam is otherwise normal as above. Patient without significant joint effusion no ligamentous laxity no bony prominence tenderness to palpation of her knee. Patient with mild tenderness to palpation of her right  thumb MCP joint. Will get imaging of the right hand and doubt osseous injury of the left knee given ability to ambulate lack of joint effusion or bony prominence tenderness. Suspect possible meniscus injury given description of the pain. We'll have patient followup with her orthopedic surgeon if pain continues.  DG Hand Complete Right (Final result)   Result time:09/30/11 [phone]    Final result by Rad Results In Interface (09/30/11 09:38:18)    Narrative:   *RADIOLOGY REPORT*  Clinical Data: Recent fall, pain in the right common  RIGHT HAND - COMPLETE 3+ VIEW  Comparison: None.  Findings: No acute fracture is seen. The radiocarpal joint space appears normal. The carpal bones are in normal position. MCP, PIP, and DIP joints are unremarkable.  IMPRESSION: No acute abnormality.  Original Report Authenticated By: Juline Patch, M.D.     Case discussed with Dr. Radford Pax.        Sherryl Manges, MD 09/30/11 [phone]

## 2011-09-30 NOTE — ED Provider Notes (Signed)
I saw and evaluated the patient, reviewed the resident's note and I agree with the findings and plan.   .Face to face Exam:  General:  Awake HEENT:  Atraumatic Resp:  Normal effort Abd:  Nondistended Neuro:No focal weakness Lymph: No adenopathy   Krist Rosenboom L Aislyn Hayse, MD 09/30/11 [id number] 

## 2011-09-30 NOTE — ED Notes (Signed)
Patient reports she slipped on water and landed on her left knee and right thumb.  Accident happened on yesterday.  Patient has swelling around the knee.  Patient has increased pain when she first stands up and with ambulation.  The more she walks the less the pain.  Patient points to her thumb as source of pain.

## 2012-01-10 ENCOUNTER — Other Ambulatory Visit (INDEPENDENT_AMBULATORY_CARE_PROVIDER_SITE_OTHER): Payer: BC Managed Care – PPO

## 2012-01-10 DIAGNOSIS — Z0000 Encounter for general adult medical examination without abnormal findings: Secondary | ICD-10-CM

## 2012-01-10 DIAGNOSIS — E876 Hypokalemia: Secondary | ICD-10-CM

## 2012-01-10 LAB — CBC WITH DIFFERENTIAL/PLATELET
Basophils Relative: 0.3 % (ref 0.0–3.0)
Eosinophils Absolute: 0.2 10*3/uL (ref 0.0–0.7)
HCT: 40.4 % (ref 36.0–46.0)
Hemoglobin: 13.1 g/dL (ref 12.0–15.0)
Lymphocytes Relative: 23.9 % (ref 12.0–46.0)
Lymphs Abs: 1.3 10*3/uL (ref 0.7–4.0)
MCHC: 32.4 g/dL (ref 30.0–36.0)
Monocytes Relative: 7.8 % (ref 3.0–12.0)
Neutro Abs: 3.5 10*3/uL (ref 1.4–7.7)
RBC: 4.43 Mil/uL (ref 3.87–5.11)

## 2012-01-10 LAB — COMPREHENSIVE METABOLIC PANEL
BUN: 10 mg/dL (ref 6–23)
CO2: 26 mEq/L (ref 19–32)
Calcium: 9.2 mg/dL (ref 8.4–10.5)
Chloride: 104 mEq/L (ref 96–112)
Creatinine, Ser: 0.8 mg/dL (ref 0.4–1.2)
GFR: 98.87 mL/min (ref >60.00)
Total Bilirubin: 0.6 mg/dL (ref 0.3–1.2)

## 2012-01-10 LAB — BASIC METABOLIC PANEL
CO2: 26 mEq/L (ref 19–32)
Calcium: 9.2 mg/dL (ref 8.4–10.5)
Potassium: 4 mEq/L (ref 3.5–5.1)
Sodium: 138 mEq/L (ref 135–145)

## 2012-01-10 LAB — URINALYSIS, ROUTINE W REFLEX MICROSCOPIC
Bilirubin Urine: NEGATIVE
Ketones, ur: NEGATIVE
Nitrite: NEGATIVE
Total Protein, Urine: NEGATIVE
Urine Glucose: NEGATIVE
pH: 6 (ref 5.0–8.0)

## 2012-01-10 LAB — LIPID PANEL
HDL: 46.3 mg/dL (ref >39.00)
Triglycerides: 73 mg/dL (ref 0.0–149.0)

## 2012-01-17 ENCOUNTER — Encounter: Payer: Self-pay | Admitting: Internal Medicine

## 2012-01-17 ENCOUNTER — Ambulatory Visit (INDEPENDENT_AMBULATORY_CARE_PROVIDER_SITE_OTHER): Payer: BC Managed Care – PPO | Admitting: Internal Medicine

## 2012-01-17 VITALS — BP 140/80 | HR 76 | Resp 16 | Ht 63.0 in | Wt 200.0 lb

## 2012-01-17 DIAGNOSIS — E785 Hyperlipidemia, unspecified: Secondary | ICD-10-CM

## 2012-01-17 DIAGNOSIS — I10 Essential (primary) hypertension: Secondary | ICD-10-CM

## 2012-01-17 DIAGNOSIS — Z0000 Encounter for general adult medical examination without abnormal findings: Secondary | ICD-10-CM | POA: Insufficient documentation

## 2012-01-17 DIAGNOSIS — Z23 Encounter for immunization: Secondary | ICD-10-CM

## 2012-01-17 DIAGNOSIS — R7309 Other abnormal glucose: Secondary | ICD-10-CM

## 2012-01-17 DIAGNOSIS — E669 Obesity, unspecified: Secondary | ICD-10-CM

## 2012-01-17 DIAGNOSIS — Z1211 Encounter for screening for malignant neoplasm of colon: Secondary | ICD-10-CM

## 2012-01-17 DIAGNOSIS — R739 Hyperglycemia, unspecified: Secondary | ICD-10-CM

## 2012-01-17 MED ORDER — LORCASERIN HCL 10 MG PO TABS: 1.0000 | ORAL_TABLET | Freq: Two times a day (BID) | ORAL | Status: DC

## 2012-01-17 MED ORDER — AMLODIPINE BESYLATE 10 MG PO TABS: 10.0000 mg | ORAL_TABLET | Freq: Every day | ORAL | Status: DC

## 2012-01-17 MED ORDER — SPIRONOLACTONE 50 MG PO TABS: 50.0000 mg | ORAL_TABLET | Freq: Every day | ORAL | Status: DC

## 2012-01-17 NOTE — Assessment & Plan Note (Signed)
Options to treat discussed Wt loss, die

## 2012-01-17 NOTE — Patient Instructions (Addendum)
BP Readings from Last 3 Encounters:  01/17/12 140/80  09/30/11 150/89  07/12/11 158/80   Wt Readings from Last 3 Encounters:  01/17/12 200 lb (90.719 kg)  07/12/11 189 lb (85.73 kg)  01/08/11 191 lb (86.637 kg)

## 2012-01-17 NOTE — Assessment & Plan Note (Signed)
Discussed Will try Belviq 

## 2012-01-17 NOTE — Assessment & Plan Note (Signed)
Better  

## 2012-01-17 NOTE — Assessment & Plan Note (Signed)
Continue with current prescription therapy as reflected on the Med list. Loose wt 

## 2012-01-17 NOTE — Assessment & Plan Note (Signed)
We discussed age appropriate health related issues, including available/recomended screening tests and vaccinations. We discussed a need for adhering to healthy diet and exercise. Labs/EKG were reviewed/ordered. All questions were answered. tdap Colon Eye exam GYN Dr Greta Doom

## 2012-01-17 NOTE — Progress Notes (Signed)
Subjective:    Patient ID: Allison Harrison, female    DOB: [birth year], 51 y.o.   MRN: [id number]  HPI The patient is here for a wellness exam. The patient has been doing well overall without major physical or psychological issues going on lately.  The patient needs to address  chronicThe patient presents for a follow-up of  chronic hypertension, not too well controlled with medicines, f/u elev CBG and knee pain.  C/o R thumb triggering - better after a steroid shot    Review of Systems  Constitutional: Negative for chills, activity change, appetite change, fatigue and unexpected weight change.  HENT: Negative for congestion, mouth sores and sinus pressure.   Eyes: Negative for visual disturbance.  Respiratory: Negative for cough and chest tightness.   Gastrointestinal: Negative for nausea and abdominal pain.  Genitourinary: Negative for frequency, difficulty urinating and vaginal pain.  Musculoskeletal: Positive for arthralgias (knee). Negative for back pain and gait problem.  Skin: Negative for pallor and rash.  Neurological: Negative for dizziness, tremors, weakness, numbness and headaches.  Psychiatric/Behavioral: Negative for confusion and disturbed wake/sleep cycle.   BP Readings from Last 3 Encounters:  01/17/12 140/80  09/30/11 150/89  07/12/11 158/80   Wt Readings from Last 3 Encounters:  01/17/12 200 lb (90.719 kg)  07/12/11 189 lb (85.73 kg)  01/08/11 191 lb (86.637 kg)        Objective:   Physical Exam  Constitutional: She appears well-developed. No distress.       obese  HENT:  Head: Normocephalic.  Right Ear: External ear normal.  Left Ear: External ear normal.  Nose: Nose normal.  Mouth/Throat: Oropharynx is clear and moist.  Eyes: Conjunctivae normal are normal. Pupils are equal, round, and reactive to light. Right eye exhibits no discharge. Left eye exhibits no discharge.  Neck: Normal range of motion. Neck supple. No JVD present. No tracheal  deviation present. No thyromegaly present.  Cardiovascular: Normal rate, regular rhythm and normal heart sounds.   Pulmonary/Chest: No stridor. No respiratory distress. She has no wheezes.  Abdominal: Soft. Bowel sounds are normal. She exhibits no distension and no mass. There is no tenderness. There is no rebound and no guarding.  Musculoskeletal: She exhibits no edema and no tenderness.  Lymphadenopathy:    She has no cervical adenopathy.  Neurological: She displays normal reflexes. No cranial nerve deficit. She exhibits normal muscle tone. Coordination normal.  Skin: No rash noted. No erythema.  Psychiatric: She has a normal mood and affect. Her behavior is normal. Judgment and thought content normal.  R thumb with no pain over palmar the MCP   Lab Results  Component Value Date   WBC 5.4 01/10/2012   HGB 13.1 01/10/2012   HCT 40.4 01/10/2012   PLT 315.0 01/10/2012   GLUCOSE 85 01/10/2012   GLUCOSE 85 01/10/2012   CHOL 237* 01/10/2012   TRIG 73.0 01/10/2012   HDL 46.30 01/10/2012   LDLDIRECT 182.0 01/10/2012   LDLCALC 137* 11/06/2009   ALT 17 01/10/2012   AST 20 01/10/2012   NA 138 01/10/2012   NA 138 01/10/2012   K 4.0 01/10/2012   K 4.0 01/10/2012   CL 104 01/10/2012   CL 104 01/10/2012   CREATININE 0.8 01/10/2012   CREATININE 0.8 01/10/2012   BUN 10 01/10/2012   BUN 10 01/10/2012   CO2 26 01/10/2012   CO2 26 01/10/2012   TSH 0.76 01/10/2012   HGBA1C 6.4 07/05/2011        Assessment &  Plan:

## 2012-02-26 ENCOUNTER — Other Ambulatory Visit: Payer: Self-pay | Admitting: Internal Medicine

## 2012-04-24 ENCOUNTER — Ambulatory Visit: Payer: BC Managed Care – PPO | Admitting: Internal Medicine

## 2012-05-09 ENCOUNTER — Other Ambulatory Visit: Payer: Self-pay | Admitting: Internal Medicine

## 2012-05-24 ENCOUNTER — Ambulatory Visit: Payer: BC Managed Care – PPO | Admitting: Internal Medicine

## 2012-07-05 ENCOUNTER — Other Ambulatory Visit: Payer: Self-pay | Admitting: Internal Medicine

## 2012-07-05 DIAGNOSIS — Z0389 Encounter for observation for other suspected diseases and conditions ruled out: Secondary | ICD-10-CM

## 2012-07-05 DIAGNOSIS — Z0000 Encounter for general adult medical examination without abnormal findings: Secondary | ICD-10-CM

## 2012-07-27 ENCOUNTER — Ambulatory Visit (INDEPENDENT_AMBULATORY_CARE_PROVIDER_SITE_OTHER): Payer: BC Managed Care – PPO | Admitting: Internal Medicine

## 2012-07-27 ENCOUNTER — Encounter: Payer: Self-pay | Admitting: Internal Medicine

## 2012-07-27 VITALS — BP 136/90 | HR 100 | Temp 98.0°F

## 2012-07-27 DIAGNOSIS — T7840XA Allergy, unspecified, initial encounter: Secondary | ICD-10-CM

## 2012-07-27 MED ORDER — PREDNISONE 10 MG PO TABS: ORAL_TABLET | ORAL | Status: DC

## 2012-07-27 NOTE — Patient Instructions (Signed)

## 2012-07-27 NOTE — Progress Notes (Signed)
Subjective:    Patient ID: Allison Harrison, female    DOB: 12-24-1960, 52 y.o.   MRN: [id number]  HPI  Pt presents to the clinic today with c/o an allergic reaction. This started ast night about 2 hours after eating an oatmeal cookie. She broke out in welps all over her body and her skin became very itchy. She denies swelling of her tongue, throat or difficulty breathing. She did take a benadryl and rubbed benadryl cream over the welps. That did help with the itching. She still complains of swelling and redness of her skin. She has never been allergic to oatmeal that she is aware of but that is the only new thing that she ate.   Review of Systems  Past Medical History  Diagnosis Date  . Hypertension   . Food allergy     wheat  . History of meniscal tear 2011    Left knee-Dr. Luiz Blare    Current Outpatient Prescriptions  Medication Sig Dispense Refill  . amLODipine (NORVASC) 10 MG tablet TAKE 1 TABLET BY MOUTH EVERY DAY  90 tablet  2  . aspirin 81 MG EC tablet Take 81 mg by mouth daily.       . Cholecalciferol (VITAMIN D3) 1000 UNITS tablet Take 1,000 Units by mouth daily.        . Lorcaserin HCl (BELVIQ) 10 MG TABS Take 1 tablet by mouth 2 (two) times daily.  60 tablet  2  . spironolactone (ALDACTONE) 50 MG tablet Take 1 tablet (50 mg total) by mouth daily.  90 tablet  3  . predniSONE (DELTASONE) 10 MG tablet Take 3 tablets on days 1-2,  Take 2 tablets on days 3-4, take 1 tablets on days 5-6  12 tablet  0   No current facility-administered medications for this visit.    Allergies  Allergen Reactions  . Wheat Bran     Family History  Problem Relation Age of Onset  . Diabetes Other   . Hypertension Other   . Asthma Other   . Hypertension Mother   . Diabetes Mother   . Glaucoma Mother   . Heart disease Father     CHF    History   Social History  . Marital Status: Married    Spouse Name: N/A    Number of Children: 2  . Years of Education: N/A   Occupational History   . 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 Smoking status: Never Smoker   . Smokeless tobacco: Not on file  . Alcohol Use: No  . Drug Use: No  . Sexually Active: Yes   Other Topics Concern  . Not on file   Social History Narrative   GYN-Dr. Greta Doom   Regular Exercise-yes     Constitutional: Denies fever, malaise, fatigue, headache or abrupt weight changes.  Respiratory: Denies difficulty breathing, shortness of breath, cough or sputum production.   Skin: Pt reports generalized redness and swelling of skin. Denieslesions or ulcercations.    No other specific complaints in a complete review of systems (except as listed in HPI above).      Objective:   Physical Exam   BP 136/90  Pulse 100  Temp(Src) 98 F (36.7 C) (Oral)  SpO2 95%  LMP 07/15/2012 Wt Readings from Last 3 Encounters:  01/17/12 200 lb (90.719 kg)  07/12/11 189 lb (85.73 kg)  01/08/11 191 lb (86.637 kg)    General: Appears their  stated age, well developed, well nourished in NAD. Skin: Warm, dry and intact. No, lesions or ulcerations noted. Generalized papular rash noted on chest and arms, worse on the back. Consistent with allergic dermatitis. Cardiovascular: Normal rate and rhythm. S1,S2 noted.  No murmur, rubs or gallops noted. No JVD or BLE edema. No carotid bruits noted. Pulmonary/Chest: Normal effort and positive vesicular breath sounds. No respiratory distress. No wheezes, rales or ronchi noted.        Assessment & Plan:   Allergic dermatitis secondary to food product, new onset:  eRx for pred taper Continue benadryl as needed  RTC as needed or if symptoms persist

## 2012-07-27 NOTE — Progress Notes (Signed)
  Subjective:    Patient ID: Allison Harrison, female    DOB: 1960/12/15, 52 y.o.   MRN: [id number]  HPI  Patient presents to the office today for "allergic reaction" which happened last night.  Ate a hamburger and an oatmeal cake.  She felt like she had welts everywhere and took an oral benedryl, and used benadryl lotion which helped with the itching.  Today she feels like she is red and warm, and has some mild swelling of her face and ears.  She feels it was the oatmeal cake which caused this reaction because Dr. Posey Rea had tested her for a wheat allergy in the past.    Review of Systems  Respiratory: Negative for chest tightness and shortness of breath.   Cardiovascular: Negative for chest pain.  Skin: Positive for color change and rash.  All other systems reviewed and are negative.       Objective:   Physical Exam  Nursing note and vitals reviewed. Constitutional: She is oriented to person, place, and time. She appears well-developed and well-nourished.  HENT:  Head: Normocephalic and atraumatic.  Eyes: Conjunctivae are normal. No scleral icterus.  Neck: Normal range of motion. Neck supple.  Cardiovascular: Normal rate, regular rhythm and normal heart sounds.  Exam reveals no gallop and no friction rub.   No murmur heard. Pulmonary/Chest: Effort normal and breath sounds normal. No respiratory distress. She has no wheezes. She has no rales. She exhibits no tenderness.  Lymphadenopathy:    She has no cervical adenopathy.  Neurological: She is alert and oriented to person, place, and time.  Skin: Skin is warm and dry. There is erythema.  Generalized erythema and warmth.  Psychiatric: She has a normal mood and affect. Her behavior is normal.          Assessment & Plan:

## 2013-01-08 ENCOUNTER — Other Ambulatory Visit (INDEPENDENT_AMBULATORY_CARE_PROVIDER_SITE_OTHER): Payer: BC Managed Care – PPO

## 2013-01-08 DIAGNOSIS — Z0389 Encounter for observation for other suspected diseases and conditions ruled out: Secondary | ICD-10-CM

## 2013-01-08 DIAGNOSIS — Z0000 Encounter for general adult medical examination without abnormal findings: Secondary | ICD-10-CM

## 2013-01-08 LAB — CBC WITH DIFFERENTIAL/PLATELET
Basophils Relative: 0.4 % (ref 0.0–3.0)
Eosinophils Relative: 6.3 % — ABNORMAL HIGH (ref 0.0–5.0)
HCT: 38 % (ref 36.0–46.0)
Hemoglobin: 12.7 g/dL (ref 12.0–15.0)
MCV: 86.6 fl (ref 78.0–100.0)
Monocytes Absolute: 0.5 10*3/uL (ref 0.1–1.0)
Neutrophils Relative %: 55.1 % (ref 43.0–77.0)
RBC: 4.38 Mil/uL (ref 3.87–5.11)
WBC: 5.3 10*3/uL (ref 4.5–10.5)

## 2013-01-08 LAB — COMPREHENSIVE METABOLIC PANEL
ALT: 18 U/L (ref 0–35)
AST: 19 U/L (ref 0–37)
CO2: 24 mEq/L (ref 19–32)
Chloride: 105 mEq/L (ref 96–112)
Creatinine, Ser: 0.8 mg/dL (ref 0.4–1.2)
GFR: 91.74 mL/min (ref >60.00)
Sodium: 137 mEq/L (ref 135–145)
Total Bilirubin: 0.7 mg/dL (ref 0.3–1.2)
Total Protein: 7 g/dL (ref 6.0–8.3)

## 2013-01-08 LAB — URINALYSIS, ROUTINE W REFLEX MICROSCOPIC
Bilirubin Urine: NEGATIVE
Leukocytes, UA: NEGATIVE
Nitrite: NEGATIVE
Urobilinogen, UA: 0.2 (ref 0.0–1.0)

## 2013-01-08 LAB — LIPID PANEL
HDL: 44.1 mg/dL (ref >39.00)
VLDL: 18.6 mg/dL (ref 0.0–40.0)

## 2013-01-08 LAB — TSH: TSH: 1.05 u[IU]/mL (ref 0.35–5.50)

## 2013-01-17 ENCOUNTER — Ambulatory Visit (INDEPENDENT_AMBULATORY_CARE_PROVIDER_SITE_OTHER): Payer: BC Managed Care – PPO | Admitting: Internal Medicine

## 2013-01-17 ENCOUNTER — Encounter: Payer: Self-pay | Admitting: Internal Medicine

## 2013-01-17 VITALS — BP 150/92 | HR 80 | Temp 98.9°F | Resp 16 | Ht 63.0 in | Wt 191.0 lb

## 2013-01-17 DIAGNOSIS — E669 Obesity, unspecified: Secondary | ICD-10-CM

## 2013-01-17 DIAGNOSIS — L272 Dermatitis due to ingested food: Secondary | ICD-10-CM

## 2013-01-17 DIAGNOSIS — Z1211 Encounter for screening for malignant neoplasm of colon: Secondary | ICD-10-CM

## 2013-01-17 DIAGNOSIS — I10 Essential (primary) hypertension: Secondary | ICD-10-CM

## 2013-01-17 DIAGNOSIS — Z0000 Encounter for general adult medical examination without abnormal findings: Secondary | ICD-10-CM

## 2013-01-17 MED ORDER — DIPHENHYDRAMINE HCL 25 MG PO TABS: 25.0000 mg | ORAL_TABLET | Freq: Four times a day (QID) | ORAL | Status: AC | PRN

## 2013-01-17 MED ORDER — PSEUDOEPHEDRINE HCL ER 120 MG PO TB12
120.0000 mg | ORAL_TABLET | Freq: Two times a day (BID) | ORAL | Status: DC | PRN
Start: 2013-01-17 — End: 2020-06-05

## 2013-01-17 MED ORDER — PREDNISONE 10 MG PO TABS: ORAL_TABLET | ORAL | Status: DC

## 2013-01-17 NOTE — Assessment & Plan Note (Signed)
We discussed age appropriate health related issues, including available/recomended screening tests and vaccinations. We discussed a need for adhering to healthy diet and exercise. Labs/EKG were reviewed/ordered. All questions were answered. Colonosc PAP

## 2013-01-17 NOTE — Patient Instructions (Signed)
Gluten free trial (no wheat products) for 4-6 weeks. OK to use gluten-free bread and gluten-free pasta.  Milk free trial (no milk, ice cream, cheese and yogurt) for 4-6 weeks. OK to use almond, coconut, rice milk. "Almond breeze" brand tastes good.  

## 2013-01-17 NOTE — Assessment & Plan Note (Signed)
Wt Readings from Last 3 Encounters:  01/17/13 191 lb (86.637 kg)  01/17/12 200 lb (90.719 kg)  07/12/11 189 lb (85.73 kg)

## 2013-01-17 NOTE — Assessment & Plan Note (Signed)
Continue with current prescription therapy as reflected on the Med list.  

## 2013-01-17 NOTE — Assessment & Plan Note (Signed)
Benadryl, Predn, Sudafed prn Gluten free trial (no wheat products) for 4-6 weeks. OK to use gluten-free bread and gluten-free pasta.  Milk free trial (no milk, ice cream, cheese and yogurt) for 4-6 weeks. OK to use almond, coconut, rice or soy milk. "Almond breeze" brand tastes good.

## 2013-01-17 NOTE — Progress Notes (Signed)
   Subjective:    HPI The patient is here for a wellness exam. The patient has been doing well overall without major physical or psychological issues going on lately. BP is nl at home  The patient needs to address  chronicThe patient presents for a follow-up of  chronic hypertension, not too well controlled with medicines, f/u elev CBG and knee pain.  C/o allergic reaction to ?food at times 1-2/year: had hives last time in May    Review of Systems  Constitutional: Negative for chills, activity change, appetite change, fatigue and unexpected weight change.  HENT: Negative for congestion, mouth sores and sinus pressure.   Eyes: Negative for visual disturbance.  Respiratory: Negative for cough and chest tightness.   Gastrointestinal: Negative for nausea and abdominal pain.  Genitourinary: Negative for frequency, difficulty urinating and vaginal pain.  Musculoskeletal: Positive for arthralgias (knee). Negative for back pain and gait problem.  Skin: Negative for pallor and rash.  Neurological: Negative for dizziness, tremors, weakness, numbness and headaches.  Psychiatric/Behavioral: Negative for confusion and sleep disturbance.   BP Readings from Last 3 Encounters:  01/17/13 150/92  07/27/12 136/90  01/17/12 140/80   Wt Readings from Last 3 Encounters:  01/17/13 191 lb (86.637 kg)  01/17/12 200 lb (90.719 kg)  07/12/11 189 lb (85.73 kg)        Objective:   Physical Exam  Constitutional: She appears well-developed. No distress.  obese  HENT:  Head: Normocephalic.  Right Ear: External ear normal.  Left Ear: External ear normal.  Nose: Nose normal.  Mouth/Throat: Oropharynx is clear and moist.  Eyes: Conjunctivae are normal. Pupils are equal, round, and reactive to light. Right eye exhibits no discharge. Left eye exhibits no discharge.  Neck: Normal range of motion. Neck supple. No JVD present. No tracheal deviation present. No thyromegaly present.  Cardiovascular: Normal  rate, regular rhythm and normal heart sounds.   Pulmonary/Chest: No stridor. No respiratory distress. She has no wheezes.  Abdominal: Soft. Bowel sounds are normal. She exhibits no distension and no mass. There is no tenderness. There is no rebound and no guarding.  Musculoskeletal: She exhibits no edema and no tenderness.  Lymphadenopathy:    She has no cervical adenopathy.  Neurological: She displays normal reflexes. No cranial nerve deficit. She exhibits normal muscle tone. Coordination normal.  Skin: No rash noted. No erythema.  Psychiatric: She has a normal mood and affect. Her behavior is normal. Judgment and thought content normal.  R thumb with no pain over palmar the MCP   Lab Results  Component Value Date   WBC 5.3 01/08/2013   HGB 12.7 01/08/2013   HCT 38.0 01/08/2013   PLT 251.0 01/08/2013   GLUCOSE 97 01/08/2013   CHOL 205* 01/08/2013   TRIG 93.0 01/08/2013   HDL 44.10 01/08/2013   LDLDIRECT 139.6 01/08/2013   LDLCALC 137* 11/06/2009   ALT 18 01/08/2013   AST 19 01/08/2013   NA 137 01/08/2013   K 4.3 01/08/2013   CL 105 01/08/2013   CREATININE 0.8 01/08/2013   BUN 13 01/08/2013   CO2 24 01/08/2013   TSH 1.05 01/08/2013   HGBA1C 6.4 07/05/2011        Assessment & Plan:

## 2013-02-24 ENCOUNTER — Other Ambulatory Visit: Payer: Self-pay | Admitting: Internal Medicine

## 2013-03-15 ENCOUNTER — Other Ambulatory Visit: Payer: Self-pay | Admitting: Internal Medicine

## 2013-03-19 ENCOUNTER — Ambulatory Visit (AMBULATORY_SURGERY_CENTER): Payer: Self-pay | Admitting: *Deleted

## 2013-03-19 VITALS — Ht 63.0 in | Wt 193.4 lb

## 2013-03-19 DIAGNOSIS — Z1211 Encounter for screening for malignant neoplasm of colon: Secondary | ICD-10-CM

## 2013-03-19 MED ORDER — MOVIPREP 100 G PO SOLR: ORAL | Status: DC

## 2013-03-19 NOTE — Progress Notes (Signed)
No allergies to eggs or soy. No problems with anesthesia.  

## 2013-03-20 ENCOUNTER — Encounter: Payer: Self-pay | Admitting: Internal Medicine

## 2013-04-03 ENCOUNTER — Ambulatory Visit (AMBULATORY_SURGERY_CENTER): Payer: BC Managed Care – PPO | Admitting: Internal Medicine

## 2013-04-03 ENCOUNTER — Encounter: Payer: Self-pay | Admitting: Internal Medicine

## 2013-04-03 VITALS — BP 135/68 | HR 61 | Temp 99.2°F | Resp 23 | Ht 63.0 in | Wt 193.0 lb

## 2013-04-03 DIAGNOSIS — D126 Benign neoplasm of colon, unspecified: Secondary | ICD-10-CM

## 2013-04-03 DIAGNOSIS — Z1211 Encounter for screening for malignant neoplasm of colon: Secondary | ICD-10-CM

## 2013-04-03 MED ORDER — SODIUM CHLORIDE 0.9 % IV SOLN: 500.0000 mL | INTRAVENOUS | Status: DC

## 2013-04-03 NOTE — Patient Instructions (Signed)
YOU HAD AN ENDOSCOPIC PROCEDURE TODAY AT THE [facility]: Refer to the procedure report that was given to you for any specific questions about what was found during the examination.  If the procedure report does not answer your questions, please call your gastroenterologist to clarify.  If you requested that your care partner not be given the details of your procedure findings, then the procedure report has been included in a sealed envelope for you to review at your convenience later.  YOU SHOULD EXPECT: Some feelings of bloating in the abdomen. Passage of more gas than usual.  Walking can help get rid of the air that was put into your GI tract during the procedure and reduce the bloating. If you had a lower endoscopy (such as a colonoscopy or flexible sigmoidoscopy) you may notice spotting of blood in your stool or on the toilet paper. If you underwent a bowel prep for your procedure, then you may not have a normal bowel movement for a few days.  DIET: Your first meal following the procedure should be a light meal and then it is ok to progress to your normal diet.  A half-sandwich or bowl of soup is an example of a good first meal.  Heavy or fried foods are harder to digest and may make you feel nauseous or bloated.  Likewise meals heavy in dairy and vegetables can cause extra gas to form and this can also increase the bloating.  Drink plenty of fluids but you should avoid alcoholic beverages for 24 hours.  ACTIVITY: Your care partner should take you home directly after the procedure.  You should plan to take it easy, moving slowly for the rest of the day.  You can resume normal activity the day after the procedure however you should NOT DRIVE or use heavy machinery for 24 hours (because of the sedation medicines used during the test).    SYMPTOMS TO REPORT IMMEDIATELY: A gastroenterologist can be reached at any hour.  During normal business hours, 8:30 AM to 5:00 PM Monday through Friday,  call [phone].  After hours and on weekends, please call the GI answering service at [phone] who will take a message and have the physician on call contact you.   Following lower endoscopy (colonoscopy or flexible sigmoidoscopy):  Excessive amounts of blood in the stool  Significant tenderness or worsening of abdominal pains  Swelling of the abdomen that is new, acute  Fever of 100F or higher  FOLLOW UP: If any biopsies were taken you will be contacted by phone or by letter within the next 1-3 weeks.  Call your gastroenterologist if you have not heard about the biopsies in 3 weeks.  Our staff will call the home number listed on your records the next business day following your procedure to check on you and address any questions or concerns that you may have at that time regarding the information given to you following your procedure. This is a courtesy call and so if there is no answer at the home number and we have not heard from you through the emergency physician on call, we will assume that you have returned to your regular daily activities without incident.  SIGNATURES/CONFIDENTIALITY: You and/or your care partner have signed paperwork which will be entered into your electronic medical record.  These signatures attest to the fact that that the information above on your After Visit Summary has been reviewed and is understood.  Full responsibility of the confidentiality of this   discharge information lies with you and/or your care-partner.  Recommendations Await pathology results High fiber diet Recall colonoscopy pending pathology report

## 2013-04-03 NOTE — Progress Notes (Signed)
A/ox3 pleased with MAC, report to wendy RN 

## 2013-04-03 NOTE — Op Note (Signed)
[name] [name], [id number]   COLONOSCOPY PROCEDURE REPORT  PATIENT: Allison Harrison, Allison Harrison  MR#: [id number] BIRTHDATE: [birth year] , 52  yrs. old GENDER: Female ENDOSCOPIST: Lafayette Dragon, MD REFERRED DV:VOHY Avel Sensor, M.D. PROCEDURE DATE:  04/03/2013 PROCEDURE:   Colonoscopy with cold biopsy polypectomy First Screening Colonoscopy - Avg.  risk and is 50 yrs.  old or older Yes.  Prior Negative Screening - Now for repeat screening. N/A  History of Adenoma - Now for follow-up colonoscopy & has been > or = to 3 yrs.  N/A  Polyps Removed Today? Yes. ASA CLASS:   Class I INDICATIONS:Average risk patient for colon cancer. MEDICATIONS: MAC sedation, administered by CRNA and Fentanyl-Detailed 280 mg IV  DESCRIPTION OF PROCEDURE:   After the risks benefits and alternatives of the procedure were thoroughly explained, informed consent was obtained.  A digital rectal exam revealed no abnormalities of the rectum.   The LB [id number]-H190 [id number]  endoscope was introduced through the anus and advanced to the cecum, which was identified by both the appendix and ileocecal valve. No adverse events experienced.   The quality of the prep was excellent, using MoviPrep  The instrument was then slowly withdrawn as the colon was fully examined.      COLON FINDINGS: Five smooth sessile polyps ranging between 3-34mm in size were found in the sigmoid colon.  A polypectomy was performed with cold forceps.  The resection was complete and the polyp tissue was completely retrieved.   There was moderate diverticulosis noted in the sigmoid colon with associated muscular hypertrophy. Retroflexed views revealed no abnormalities. The time to cecum=6 minutes 50 seconds.  Withdrawal time=8 minutes 0 seconds.  The scope was withdrawn and the procedure completed. COMPLICATIONS: There were no complications.  ENDOSCOPIC IMPRESSION: Four sessile polyps ranging between 3-58mm in size were  found in the sigmoid colon and one 3 mm polyp removed from the cecum polypectomy was performed with cold forceps moderately severe diverticulosis of the sigmoid colon with muscular hypertrophy  RECOMMENDATIONS: 1.  Await pathology results 2.  high fiber diet ReCall colonoscopy pending path report   eSigned:  Lafayette Dragon, MD 04/03/2013 10:08 AM   cc:   PATIENT NAME:  [name], [name] MR#: [id number]

## 2013-04-03 NOTE — Progress Notes (Signed)
Called to room to assist during endoscopic procedure.  Patient ID and intended procedure confirmed with present staff. Received instructions for my participation in the procedure from the performing physician.  

## 2013-04-04 ENCOUNTER — Telehealth: Payer: Self-pay | Admitting: *Deleted

## 2013-04-04 NOTE — Telephone Encounter (Signed)
Called patient.  Patient at work. Ok after procedure yesterday.

## 2013-04-09 ENCOUNTER — Encounter: Payer: Self-pay | Admitting: Internal Medicine

## 2013-04-11 ENCOUNTER — Encounter: Payer: Self-pay | Admitting: *Deleted

## 2013-07-18 ENCOUNTER — Ambulatory Visit: Payer: BC Managed Care – PPO | Admitting: Internal Medicine

## 2013-09-13 ENCOUNTER — Other Ambulatory Visit: Payer: Self-pay | Admitting: Gynecology

## 2013-09-14 LAB — CYTOLOGY - PAP

## 2014-03-20 ENCOUNTER — Other Ambulatory Visit: Payer: Self-pay | Admitting: Internal Medicine

## 2014-03-23 ENCOUNTER — Other Ambulatory Visit: Payer: Self-pay | Admitting: Internal Medicine

## 2014-06-26 ENCOUNTER — Other Ambulatory Visit: Payer: Self-pay | Admitting: Internal Medicine

## 2014-06-27 ENCOUNTER — Other Ambulatory Visit: Payer: Self-pay | Admitting: Internal Medicine

## 2014-06-27 NOTE — Telephone Encounter (Signed)
Refill done.  

## 2014-07-02 ENCOUNTER — Other Ambulatory Visit (INDEPENDENT_AMBULATORY_CARE_PROVIDER_SITE_OTHER): Payer: Self-pay

## 2014-07-02 ENCOUNTER — Other Ambulatory Visit: Payer: Self-pay | Admitting: *Deleted

## 2014-07-02 DIAGNOSIS — Z0000 Encounter for general adult medical examination without abnormal findings: Secondary | ICD-10-CM

## 2014-07-02 LAB — HEPATIC FUNCTION PANEL
ALBUMIN: 4.1 g/dL (ref 3.5–5.2)
ALK PHOS: 65 U/L (ref 39–117)
ALT: 14 U/L (ref 0–35)
AST: 16 U/L (ref 0–37)
BILIRUBIN TOTAL: 0.5 mg/dL (ref 0.2–1.2)
Bilirubin, Direct: 0.1 mg/dL (ref 0.0–0.3)
TOTAL PROTEIN: 7.2 g/dL (ref 6.0–8.3)

## 2014-07-02 LAB — CBC WITH DIFFERENTIAL/PLATELET
BASOS ABS: 0 10*3/uL (ref 0.0–0.1)
Basophils Relative: 0.4 % (ref 0.0–3.0)
EOS ABS: 0.3 10*3/uL (ref 0.0–0.7)
Eosinophils Relative: 5.5 % — ABNORMAL HIGH (ref 0.0–5.0)
HEMATOCRIT: 38.1 % (ref 36.0–46.0)
Hemoglobin: 12.6 g/dL (ref 12.0–15.0)
LYMPHS PCT: 25.2 % (ref 12.0–46.0)
Lymphs Abs: 1.3 10*3/uL (ref 0.7–4.0)
MCHC: 33.2 g/dL (ref 30.0–36.0)
MCV: 87.7 fl (ref 78.0–100.0)
MONOS PCT: 9.3 % (ref 3.0–12.0)
Monocytes Absolute: 0.5 10*3/uL (ref 0.1–1.0)
Neutro Abs: 3.1 10*3/uL (ref 1.4–7.7)
Neutrophils Relative %: 59.6 % (ref 43.0–77.0)
Platelets: 277 10*3/uL (ref 150.0–400.0)
RBC: 4.34 Mil/uL (ref 3.87–5.11)
RDW: 13.7 % (ref 11.5–15.5)
WBC: 5.2 10*3/uL (ref 4.0–10.5)

## 2014-07-02 LAB — BASIC METABOLIC PANEL
BUN: 11 mg/dL (ref 6–23)
CHLORIDE: 105 meq/L (ref 96–112)
CO2: 27 mEq/L (ref 19–32)
Calcium: 9.3 mg/dL (ref 8.4–10.5)
Creatinine, Ser: 0.75 mg/dL (ref 0.40–1.20)
GFR: 103.96 mL/min (ref >60.00)
Glucose, Bld: 95 mg/dL (ref 70–99)
POTASSIUM: 4 meq/L (ref 3.5–5.1)
SODIUM: 137 meq/L (ref 135–145)

## 2014-07-02 LAB — URINALYSIS, ROUTINE W REFLEX MICROSCOPIC
Bilirubin Urine: NEGATIVE
HGB URINE DIPSTICK: NEGATIVE
Ketones, ur: NEGATIVE
Leukocytes, UA: NEGATIVE
NITRITE: NEGATIVE
RBC / HPF: NONE SEEN (ref 0–?)
Specific Gravity, Urine: 1.02 (ref 1.000–1.030)
Total Protein, Urine: NEGATIVE
Urine Glucose: NEGATIVE
Urobilinogen, UA: 0.2 (ref 0.0–1.0)
WBC, UA: NONE SEEN (ref 0–?)
pH: 6 (ref 5.0–8.0)

## 2014-07-02 LAB — LIPID PANEL
Cholesterol: 221 mg/dL — ABNORMAL HIGH (ref 0–200)
HDL: 46 mg/dL (ref >39.00)
LDL Cholesterol: 156 mg/dL — ABNORMAL HIGH (ref 0–99)
NONHDL: 175
Total CHOL/HDL Ratio: 5
Triglycerides: 97 mg/dL (ref 0.0–149.0)
VLDL: 19.4 mg/dL (ref 0.0–40.0)

## 2014-07-02 LAB — TSH: TSH: 0.75 u[IU]/mL (ref 0.35–4.50)

## 2014-07-05 ENCOUNTER — Ambulatory Visit (INDEPENDENT_AMBULATORY_CARE_PROVIDER_SITE_OTHER): Payer: BLUE CROSS/BLUE SHIELD | Admitting: Internal Medicine

## 2014-07-05 ENCOUNTER — Encounter: Payer: Self-pay | Admitting: Internal Medicine

## 2014-07-05 VITALS — BP 158/98 | HR 77 | Ht 63.0 in | Wt 190.0 lb

## 2014-07-05 DIAGNOSIS — E669 Obesity, unspecified: Secondary | ICD-10-CM

## 2014-07-05 DIAGNOSIS — I10 Essential (primary) hypertension: Secondary | ICD-10-CM | POA: Diagnosis not present

## 2014-07-05 DIAGNOSIS — Z0100 Encounter for examination of eyes and vision without abnormal findings: Secondary | ICD-10-CM | POA: Diagnosis not present

## 2014-07-05 DIAGNOSIS — M79672 Pain in left foot: Secondary | ICD-10-CM

## 2014-07-05 DIAGNOSIS — Z0000 Encounter for general adult medical examination without abnormal findings: Secondary | ICD-10-CM

## 2014-07-05 DIAGNOSIS — L272 Dermatitis due to ingested food: Secondary | ICD-10-CM

## 2014-07-05 MED ORDER — SPIRONOLACTONE 50 MG PO TABS: 50.0000 mg | ORAL_TABLET | Freq: Every day | ORAL | Status: DC

## 2014-07-05 MED ORDER — AMLODIPINE BESYLATE 10 MG PO TABS: 10.0000 mg | ORAL_TABLET | Freq: Every day | ORAL | Status: DC

## 2014-07-05 MED ORDER — MELOXICAM 7.5 MG PO TABS: 7.5000 mg | ORAL_TABLET | Freq: Every day | ORAL | Status: DC

## 2014-07-05 NOTE — Assessment & Plan Note (Addendum)
We discussed age appropriate health related issues, including available/recomended screening tests and vaccinations. We discussed a need for adhering to healthy diet and exercise. Labs/EKG were reviewed/ordered. All questions were answered. Loose wt. Sch Ophth appt GYN q 12 mo Colon up to date

## 2014-07-05 NOTE — Assessment & Plan Note (Signed)
4/16 inner foot Dr Tamala Julian Meloxicam prn

## 2014-07-05 NOTE — Assessment & Plan Note (Signed)
Wt Readings from Last 3 Encounters:  07/05/14 190 lb (86.183 kg)  04/03/13 193 lb (87.544 kg)  03/19/13 193 lb 6.4 oz (87.726 kg)

## 2014-07-05 NOTE — Assessment & Plan Note (Signed)
Restart Norvasc, Spironolactone

## 2014-07-05 NOTE — Progress Notes (Signed)
Pre visit review using our clinic review tool, if applicable. No additional management support is needed unless otherwise documented below in the visit note. 

## 2014-07-05 NOTE — Assessment & Plan Note (Signed)
No relapse 

## 2014-07-05 NOTE — Progress Notes (Signed)
   Subjective:    HPI The patient is here for a wellness exam. C/o R inner ankle pain x 2 mo. BP is nl at home usually. Pt ran out of meds  The patient needs to address  chronicThe patient presents for a follow-up of  chronic hypertension, not too well controlled with medicines, f/u elev CBG and knee pain.  C/o allergic reaction to ?food at times 1-2/year: had hives last time in May 2014    Review of Systems  Constitutional: Negative for chills, activity change, appetite change, fatigue and unexpected weight change.  HENT: Negative for congestion, mouth sores and sinus pressure.   Eyes: Negative for visual disturbance.  Respiratory: Negative for cough and chest tightness.   Gastrointestinal: Negative for nausea and abdominal pain.  Genitourinary: Negative for frequency, difficulty urinating and vaginal pain.  Musculoskeletal: Positive for arthralgias (knee). Negative for back pain and gait problem.  Skin: Negative for pallor and rash.  Neurological: Negative for dizziness, tremors, weakness, numbness and headaches.  Psychiatric/Behavioral: Negative for confusion and sleep disturbance.   BP Readings from Last 3 Encounters:  07/05/14 158/98  04/03/13 135/68  01/17/13 150/92   Wt Readings from Last 3 Encounters:  07/05/14 190 lb (86.183 kg)  04/03/13 193 lb (87.544 kg)  03/19/13 193 lb 6.4 oz (87.726 kg)        Objective:   Physical Exam  Constitutional: She appears well-developed. No distress.  obese  HENT:  Head: Normocephalic.  Right Ear: External ear normal.  Left Ear: External ear normal.  Nose: Nose normal.  Mouth/Throat: Oropharynx is clear and moist.  Eyes: Conjunctivae are normal. Pupils are equal, round, and reactive to light. Right eye exhibits no discharge. Left eye exhibits no discharge.  Neck: Normal range of motion. Neck supple. No JVD present. No tracheal deviation present. No thyromegaly present.  Cardiovascular: Normal rate, regular rhythm and normal  heart sounds.   Pulmonary/Chest: No stridor. No respiratory distress. She has no wheezes.  Abdominal: Soft. Bowel sounds are normal. She exhibits no distension and no mass. There is no tenderness. There is no rebound and no guarding.  Musculoskeletal: She exhibits no edema or tenderness.  Lymphadenopathy:    She has no cervical adenopathy.  Neurological: She displays normal reflexes. No cranial nerve deficit. She exhibits normal muscle tone. Coordination normal.  Skin: No rash noted. No erythema.  Psychiatric: She has a normal mood and affect. Her behavior is normal. Judgment and thought content normal.  L inner ankle is tender   Lab Results  Component Value Date   WBC 5.2 07/02/2014   HGB 12.6 07/02/2014   HCT 38.1 07/02/2014   PLT 277.0 07/02/2014   GLUCOSE 95 07/02/2014   CHOL 221* 07/02/2014   TRIG 97.0 07/02/2014   HDL 46.00 07/02/2014   LDLDIRECT 139.6 01/08/2013   LDLCALC 156* 07/02/2014   ALT 14 07/02/2014   AST 16 07/02/2014   NA 137 07/02/2014   K 4.0 07/02/2014   CL 105 07/02/2014   CREATININE 0.75 07/02/2014   BUN 11 07/02/2014   CO2 27 07/02/2014   TSH 0.75 07/02/2014   HGBA1C 6.4 07/05/2011        Assessment & Plan:

## 2014-07-16 ENCOUNTER — Encounter: Payer: Self-pay | Admitting: Internal Medicine

## 2014-07-30 ENCOUNTER — Ambulatory Visit (INDEPENDENT_AMBULATORY_CARE_PROVIDER_SITE_OTHER): Payer: BLUE CROSS/BLUE SHIELD | Admitting: Family Medicine

## 2014-07-30 ENCOUNTER — Encounter: Payer: Self-pay | Admitting: Family Medicine

## 2014-07-30 VITALS — BP 130/82 | HR 80 | Ht 63.0 in | Wt 191.0 lb

## 2014-07-30 DIAGNOSIS — M6789 Other specified disorders of synovium and tendon, multiple sites: Secondary | ICD-10-CM

## 2014-07-30 DIAGNOSIS — M76829 Posterior tibial tendinitis, unspecified leg: Secondary | ICD-10-CM

## 2014-07-30 MED ORDER — DICLOFENAC SODIUM 2 % TD SOLN: TRANSDERMAL | Status: DC

## 2014-07-30 NOTE — Patient Instructions (Signed)
Good to see you.  Ice 20 minutes 2 times daily. Usually after activity and before bed. Exercises 3 times a week.  Good shoes with rigid bottom.  Allison Harrison, Merrell or New balance greater then 700 We will get you orthotics next week.  Try pennsaid twice daily as needed Turmeric 500mg  twice daily See me again in 3-4 weeks.    Posterior Tibial Tendon Rupture with Rehab Tendons are soft tissues that connect muscle to bone. Tendons allow muscles to move the skeletal system. A complete tear of the posterior tibial tendon is known as a posterior tendon rupture. The posterior tibial tendon attaches the posterior muscles on the inner portion of the back of the lower leg (tibialis muscles) to foot. The posterior tibialis muscles help straighten (plantar flex) and rotate the foot inward (medially rotate) the foot. A posterior tibial rupture will result in a decreased ability to perform these tasks. SYMPTOMS   A "pop" or tear felt and/or heard in the area at the time of injury.  Pain, tenderness, inflammation, and/or bruising (contusion) around the medial inner side of the ankle.  Pain that worsens with dorsiflexion (opposite of plantar flexion) of the foot.  Decreased ankle strength.  Decrease in the prominence of the arch in the sole of the foot. CAUSES  Tendon ruptures occur when a force is placed on the tendon that is greater than it can withstand. Common mechanisms of injury include:  An event that places great stress on the tendon (jumping or starting a sprint).  Direct trauma to the ankle. RISK INCREASES WITH:  Sports that involve forceful and explosive plantar flexion (jumping or quick starts).  Poor strength and flexibility.  Previous injury to the posterior tibial tendon.  Corticosteroid injection into the posterior tibial tendon.  Obesity.  Poor vascular circulation. PREVENTION   Warm up and stretch properly before activity.  Allow for adequate recovery between  workouts.  Maintain physical fitness:  Strength, flexibility, and endurance.  Cardiovascular fitness.  Maintain a healthy body weight.  Arch supports (orthotics), if you have flat feet.  Limit ankle movement by taping, wearing a brace, or using compression bandages. PROGNOSIS  In order to have the highest likelihood of returning to your preinjury activity level, surgery is usually recommended, with 4 to 9 months of rehabilitation afterward.  RELATED COMPLICATIONS   Decreased ability to plantar flex.  Rerupture of the posterior tibial tendon.  Prolonged disability.  Flat feet.  Arthritis of the foot.  Risks of surgery: infection, bleeding, nerve damage, or damage to surrounding tissues. TREATMENT  Treatment initially involves resting from any activities that aggravate your symptoms. Ice, medication, and elevation can be used to help reduce pain and inflammation. In order to have the best results, surgery is recommended. Surgery involves sewing (suturing) the ends of the torn tendon back together. If your surgeon cannot repair the tendon, then a replacement (reconstruction) surgery may be performed to use another tendon to replace the function of the ruptured tendon. After surgery, the ankle is immobilized in order to allow the tendon to heal. After immobilization, it is important to perform strengthening and stretching exercises to help regain strength and a full range of motion. These exercises may be completed at home or with a therapist. MEDICATION   Nonsteroidal anti-inflammatory medications, such as aspirin and ibuprofen (do not take within 7 days before surgery), or other minor pain relievers, such as acetaminophen, are often recommended. Take these as directed by your caregiver. Contact your caregiver immediately if  any bleeding, stomach upset, or signs of an allergic reaction occur.  Pain relievers may be prescribed as necessary by your caregiver. Use only as directed and  only as much as you need. SEEK MEDICAL CARE IF:  Treatment seems to offer no benefit, or the condition worsens.  Any medications produce adverse side effects.  Any complications from surgery occur:  Pain, numbness, or coldness in the extremity operated upon.  Discoloration of the nail beds (they become blue or gray) of the extremity operated upon.  Signs of infections (fever, pain, inflammation, redness, or persistent bleeding). EXERCISES RANGE OF MOTION (ROM) AND STRETCHING EXERCISES - Posterior Tibial Tendon Rupture These exercises may help you when beginning to rehabilitate your injury. Your symptoms may resolve with or without further involvement from your physician, physical therapist, or athletic trainer. While completing these exercises, remember:   Restoring tissue flexibility helps normal motion to return to the joints. This allows healthier, less painful movement and activity.  An effective stretch should be held for at least 30 seconds.  A stretch should never be painful. You should only feel a gentle lengthening or release in the stretched tissue. STRETCH - Gastrocsoleus   Sit with your right / left leg extended. Holding onto both ends of a belt or towel, loop it around the ball of your foot.  Keeping your right / left ankle and foot relaxed and your knee straight, pull your foot and ankle toward you using the belt/towel.  You should feel a gentle stretch behind your calf or knee. Hold this position for __________ seconds. Repeat __________ times. Complete this stretch __________. times per day.  RANGE OF MOTION - Dorsi/Plantar Flexion  While sitting with your right / left knee straight, draw the top of your foot upwards by flexing your ankle. Then reverse the motion, pointing your toes downward.  Hold each position for __________ seconds.  After completing your first set of exercises, repeat this exercise with your knee bent. Repeat __________ times. Complete this  exercise __________ times per day.  RANGE OF MOTION - Ankle Plantar Flexion   Sit with your right / left leg crossed over your opposite knee.  Use your opposite hand to pull the top of your foot and toes toward you.  You should feel a gentle stretch on the top of your foot/ankle. Hold this position for __________ seconds. Repeat __________ times. Complete __________ times per day.  RANGE OF MOTION - Ankle Eversion   Sit with your right / left ankle crossed over your opposite knee.  Grip your foot with your opposite hand, placing your thumb on the top of your foot and your fingers across the bottom of your foot.  Gently push your foot downward with a slight rotation so your littlest toes rise slightly.  You should feel a gentle stretch on the inside of your ankle. Hold the stretch for __________ seconds. Repeat __________ times. Complete this exercise __________ times per day.  RANGE OF MOTION - Ankle Inversion   Sit with your right / left ankle crossed over your opposite knee.  Grip your foot with your opposite hand, placing your thumb on the bottom of your foot and your fingers across the top of your foot.  Gently pull your foot so the smallest toe comes toward you and your thumb pushes the inside of the ball of your foot away from you.  You should feel a gentle stretch on the outside of your ankle. Hold the stretch for __________ seconds. Repeat  __________ times. Complete this exercise __________ times per day.  RANGE OF MOTION - Ankle Alphabet  Imagine your right / left big toe is a pen.  Keeping your hip and knee still, write out the entire alphabet with your "pen." Make the letters as large as you can without increasing any discomfort. Repeat __________ times. Complete this exercise __________ times per day.  STRENGTHENING EXERCISES - Posterior Tibial Tendon Rupture These exercises may help you when beginning to rehabilitate your injury. They may resolve your symptoms with or  without further involvement from your physician, physical therapist, or athletic trainer. While completing these exercises, remember:   Muscles can gain both the endurance and the strength needed for everyday activities through controlled exercises.  Complete these exercises as instructed by your physician, physical therapist, or athletic trainer. Progress the resistance and repetitions only as guided. STRENGTH - Dorsiflexors  Secure a rubber exercise band/tubing to a fixed object (table, pole) and loop the other end around your right / left foot.  Sit on the floor facing the fixed object. The band/tubing should be slightly tense when your foot is relaxed.  Slowly draw your foot back toward you using your ankle and toes.  Hold this position for __________ seconds. Slowly release the tension in the band and return your foot to the starting position. Repeat __________ times. Complete this exercise __________ times per day.  STRENGTH - Plantar Flexors   Sit with your right / left leg extended. Holding onto both ends of a rubber exercise band/tubing, loop it around the ball of your foot. Keep a slight tension in the band.  Slowly push your toes away from you, pointing them downward.  Hold this position for __________ seconds. Return slowly, controlling the tension in the band/tubing. Repeat __________ times. Complete this exercise __________ times per day.  STRENGTH - Towel Curls  Sit in a chair positioned on a non-carpeted surface.  Place your foot on a towel, keeping your heel on the floor.  Pull the towel toward your heel by only curling your toes. Keep your heel on the floor.  If instructed by your physician, physical therapist or athletic trainer, add ____________________ at the end of the towel. Repeat __________ times. Complete this exercise __________ times per day. STRENGTH - Ankle Inversion   Secure one end of a rubber exercise band/tubing to a fixed object (table, pole).  Loop the other end around your foot just before your toes.  Place your fists between your knees. This will focus your strengthening at your ankle.  Slowly, pull your big toe up and in, making sure the band/tubing is positioned to resist the entire motion.  Hold this position for __________ seconds.  Have your muscles resist the band/tubing as it slowly pulls your foot back to the starting position. Repeat __________ times. Complete this exercise __________ times per day.  Document Released: 03/15/2005 Document Revised: 07/30/2013 Document Reviewed: 06/27/2008 [facility] Patient Information 2015 [place], [place]. This information is not intended to replace advice given to you by your health care provider. Make sure you discuss any questions you have with your health care provider.

## 2014-07-30 NOTE — Progress Notes (Signed)
Pre visit review using our clinic review tool, if applicable. No additional management support is needed unless otherwise documented below in the visit note. 

## 2014-07-30 NOTE — Progress Notes (Signed)
[facility] [place], [place] [address] Phone: [phone] Subjective:    I'm seeing this patient by the request  of:  Walker Kehr, MD   CC: bilateral foot pain  [id number] XITLALY Allison Harrison is a 54 y.o. female coming in with complaint of bilateral foot pain. Patient states that it seems start on the right in the left seems to be worse. Patient does do a lot of walking on 12 hour shifts on hard concrete multiple times a week. Patient states that the pain seems to be on the medial aspect of the ankles bilaterally. Patient states during activity doesn't seem to be that bad but afterwards can be very tight. Patient states that unfortunately she has so much pain that if she sits for 15 minutes she is unable to walk for some time. Patient states that the first at the morning no seems to be better. Patient has tried meloxicam which has been helpful. Patient states that this has not limited her but it seems to be worsening over the course of time and does not remember any injury. Rates the severity of pain is 6 out of 10. No numbness or weakness of the ankles or feet bilaterally.  Past Medical History  Diagnosis Date  . Hypertension   . Food allergy     wheat  . History of meniscal tear 2011    Left knee-Dr. Berenice Primas   Past Surgical History  Procedure Laterality Date  . Breast surgery Bilateral 1994    Reduction  . Tubal ligation  1991  . Knee arthroscopy  2011    Left   History  Substance Use Topics  . Smoking status: Never Smoker   . Smokeless tobacco: Never Used  . Alcohol Use: No   Family History  Problem Relation Age of Onset  . Diabetes Other   . Hypertension Other   . Asthma Other   . Hypertension Mother   . Diabetes Mother   . Glaucoma Mother   . Heart disease Father     CHF  . Colon cancer Neg Hx    Allergies  Allergen Reactions  . Wheat Bran Itching and Swelling      Past medical history, social, surgical and  family history all reviewed in electronic medical record.   Review of Systems: No headache, visual changes, nausea, vomiting, diarrhea, constipation, dizziness, abdominal pain, skin rash, fevers, chills, night sweats, weight loss, swollen lymph nodes, body aches, joint swelling, muscle aches, chest pain, shortness of breath, mood changes.   Objective Blood pressure 130/82, pulse 80, weight 191 lb (86.637 kg), SpO2 99 %.  General: No apparent distress alert and oriented x3 mood and affect normal, dressed appropriately.  HEENT: Pupils equal, extraocular movements intact  Respiratory: Patient's speak in full sentences and does not appear short of breath  Cardiovascular: No lower extremity edema, non tender, no erythema  Skin: Warm dry intact with no signs of infection or rash on extremities or on axial skeleton.  Abdomen: Soft nontender  Neuro: Cranial nerves II through XII are intact, neurovascularly intact in all extremities with 2+ DTRs and 2+ pulses.  Lymph: No lymphadenopathy of posterior or anterior cervical chain or axillae bilaterally.  Gait normal with good balance and coordination.  MSK:  Non tender with full range of motion and good stability and symmetric strength and tone of shoulders, elbows, wrist, hip, knee and ankles bilaterally.  Foot exam shows the patient does have severe collapse of the  longitudinal arch bilaterally with overpronation of the hindfeet. Patient also has collapse of the transverse arch bilaterally with bunionette and bunionette formation. Patient does have ambulation that shows the patient has even more dysfunction of the posterior tibialis.mild tenderness over the posterior tibialis tendon with no significant tenderness over the navicular bone. Full range of motion of the ankle with full strength and neurovascular intact distally.  Procedure note 33354; 15 minutes spent for Therapeutic exercises as stated in above notes.  This included exercises focusing on  stretching, strengthening, with significant focus on eccentric aspects.  Ankle strengthening that included:  Basic range of motion exercises to allow proper full motion at ankle Stretching of the lower leg and hamstrings  Theraband exercises for the lower leg - inversion, eversion, dorsiflexion and plantarflexion each to be completed with a theraband Balance exercises to increase proprioception Weight bearing exercises to increase strength and balance Proper technique shown and discussed handout in great detail with ATC.  All questions were discussed and answered.     Impression and Recommendations:     This case required medical decision making of moderate complexity.

## 2014-07-30 NOTE — Assessment & Plan Note (Signed)
Bilateral overall and we discussed intervening to her pain. We discussed icing regimen, home exercises, topical anti-inflammatories, as well as patient will be set up for custom orthotics in the near future. Patient will try this conservative therapy and come back and see me again in 3 weeks. If continuing to have discomfort we may want to consider injection under ultrasound guidance of the posterior tendon sheath. Differential includes also a navicular stress fracture but patient is not seem significant and painful over the area.

## 2014-08-08 ENCOUNTER — Encounter: Payer: Self-pay | Admitting: Family Medicine

## 2014-08-08 ENCOUNTER — Ambulatory Visit (INDEPENDENT_AMBULATORY_CARE_PROVIDER_SITE_OTHER): Payer: BLUE CROSS/BLUE SHIELD | Admitting: Family Medicine

## 2014-08-08 VITALS — BP 140/84 | HR 69

## 2014-08-08 DIAGNOSIS — M6789 Other specified disorders of synovium and tendon, multiple sites: Secondary | ICD-10-CM

## 2014-08-08 DIAGNOSIS — M76829 Posterior tibial tendinitis, unspecified leg: Secondary | ICD-10-CM

## 2014-08-08 NOTE — Patient Instructions (Signed)

## 2014-08-08 NOTE — Progress Notes (Signed)
Patient was fitted for a : standard, cushioned, semi-rigid orthotic. The orthotic was heated and afterward the patient was in a seated position and the orthotic molded. The patient was positioned in subtalar neutral position and 10 degrees of ankle dorsiflexion in a non-weight bearing stance. After completion of molding, patient did have orthotic management which included instructions on acclimating to the orthotics, signs of ill fit as well as care for the orthotic.  I spent 89minutes fitting the patient, discussing proper fit and care of the orthotics as well as fitting them to other shoes.   The blank was ground to a stable position for weight bearing. Size: 8 (Igli Active)  Base: Carbon fiber Additional Posting and Padding: The following postings were fitted onto the molded orthotics to help maintain a talar neutral position - Wedge posting for transverse arch:  Left side 428/76     Silicone posting for longitudinal arch: 250/100 and 250/70 bilaterally  The patient ambulated these, and they were very comfortable and supportive.

## 2014-08-08 NOTE — Assessment & Plan Note (Signed)
Patient was put in orthotics today and we'll slowly increase the wear over the course of time. Patient will follow instructions and was given verbal as well as written instruction. Patient continue with the other conservative therapy and then follow-up in 2-4 weeks for further evaluation and treatment.

## 2014-08-08 NOTE — Progress Notes (Signed)
Pre visit review using our clinic review tool, if applicable. No additional management support is needed unless otherwise documented below in the visit note. 

## 2014-08-27 ENCOUNTER — Encounter: Payer: Self-pay | Admitting: Family Medicine

## 2014-08-27 ENCOUNTER — Ambulatory Visit (INDEPENDENT_AMBULATORY_CARE_PROVIDER_SITE_OTHER): Payer: BLUE CROSS/BLUE SHIELD | Admitting: Family Medicine

## 2014-08-27 VITALS — BP 132/86 | HR 81 | Ht 63.0 in | Wt 194.0 lb

## 2014-08-27 DIAGNOSIS — M76829 Posterior tibial tendinitis, unspecified leg: Secondary | ICD-10-CM

## 2014-08-27 DIAGNOSIS — M6789 Other specified disorders of synovium and tendon, multiple sites: Secondary | ICD-10-CM | POA: Diagnosis not present

## 2014-08-27 MED ORDER — DICLOFENAC SODIUM 2 % TD SOLN: TRANSDERMAL | Status: DC

## 2014-08-27 NOTE — Patient Instructions (Signed)
Good to see you Ice is your friend if any pain Do not lace last eye on the shoe.  Continue the orthotics and we will get you another pair.  June 20th at 330 pm Continue the exercises 2-3 times a week.  pennsaid pinkie amount topically 2 times daily as needed.  See me when you need me.

## 2014-08-27 NOTE — Assessment & Plan Note (Signed)
Patient is doing significantly better. Patient was given up her prescription in for topical anti-implant was. We discussed icing and continuing home exercises. Patient with continue custom orthotics and patient like another pair which we will set up for patient. Patient knows that likely insurance will not pay for this secondary repair. Patient will come back and see me again on an as-needed basis.

## 2014-08-27 NOTE — Progress Notes (Signed)
Pre visit review using our clinic review tool, if applicable. No additional management support is needed unless otherwise documented below in the visit note. 

## 2014-08-27 NOTE — Progress Notes (Signed)
[facility] [address] [place], [place] [address] Phone: [phone] Subjective:    I'm seeing this patient by the request  of:  Walker Kehr, MD   CC: bilateral foot pain follow-up  [id number] Allison Harrison is a 54 y.o. female coming in with complaint of bilateral foot pain. Patient states that it seems start on the right in the left seems to be worse. Patient was found to have some posterior tibialis tendon insufficiency. Patient was given home exercises, icing protocol, as well as patient was put in custom orthotics. Patient does have topical as well as oral anti-inflammatory's. Patient states she is 100% better. Patient states that she is no longer having any ankle pain. Mild foot pain but nothing that stops her from activity. Patient is resting comfortably states that the topical anti-inflammatory else when she does have a mild pain.  Past Medical History  Diagnosis Date  . Hypertension   . Food allergy     wheat  . History of meniscal tear 2011    Left knee-Dr. Berenice Primas   Past Surgical History  Procedure Laterality Date  . Breast surgery Bilateral 1994    Reduction  . Tubal ligation  1991  . Knee arthroscopy  2011    Left   History  Substance Use Topics  . Smoking status: Never Smoker   . Smokeless tobacco: Never Used  . Alcohol Use: No   Family History  Problem Relation Age of Onset  . Diabetes Other   . Hypertension Other   . Asthma Other   . Hypertension Mother   . Diabetes Mother   . Glaucoma Mother   . Heart disease Father     CHF  . Colon cancer Neg Hx    Allergies  Allergen Reactions  . Wheat Bran Itching and Swelling      Past medical history, social, surgical and family history all reviewed in electronic medical record.   Review of Systems: No headache, visual changes, nausea, vomiting, diarrhea, constipation, dizziness, abdominal pain, skin rash, fevers, chills, night sweats, weight loss, swollen lymph  nodes, body aches, joint swelling, muscle aches, chest pain, shortness of breath, mood changes.   Objective Blood pressure 132/86, pulse 81, height 5\' 3"  (1.6 m), weight 194 lb (87.998 kg), SpO2 99 %.  General: No apparent distress alert and oriented x3 mood and affect normal, dressed appropriately.  HEENT: Pupils equal, extraocular movements intact  Respiratory: Patient's speak in full sentences and does not appear short of breath  Cardiovascular: No lower extremity edema, non tender, no erythema  Skin: Warm dry intact with no signs of infection or rash on extremities or on axial skeleton.  Abdomen: Soft nontender  Neuro: Cranial nerves II through XII are intact, neurovascularly intact in all extremities with 2+ DTRs and 2+ pulses.  Lymph: No lymphadenopathy of posterior or anterior cervical chain or axillae bilaterally.  Gait normal with good balance and coordination.  MSK:  Non tender with full range of motion and good stability and symmetric strength and tone of shoulders, elbows, wrist, hip, knee and ankles bilaterally.  Foot exam shows the patient does have severe collapse of the longitudinal arch bilaterally with overpronation of the hindfeet. Patient also has collapse of the transverse arch bilaterally with bunionette and bunionette formation. Patient does have ambulation that shows the patient has even more dysfunction of the posterior tibialis. Nontender on exam today.      Impression and Recommendations:     This  case required medical decision making of moderate complexity.

## 2014-08-28 LAB — HM MAMMOGRAPHY

## 2014-09-15 ENCOUNTER — Emergency Department (HOSPITAL_COMMUNITY)
Admission: EM | Admit: 2014-09-15 | Discharge: 2014-09-15 | Disposition: A | Discharge status: Home / Self Care | Payer: BLUE CROSS/BLUE SHIELD | Attending: Emergency Medicine | Admitting: Emergency Medicine

## 2014-09-15 ENCOUNTER — Encounter (HOSPITAL_COMMUNITY): Payer: Self-pay | Admitting: Emergency Medicine

## 2014-09-15 DIAGNOSIS — M25512 Pain in left shoulder: Secondary | ICD-10-CM

## 2014-09-15 DIAGNOSIS — Y92410 Unspecified street and highway as the place of occurrence of the external cause: Secondary | ICD-10-CM | POA: Insufficient documentation

## 2014-09-15 DIAGNOSIS — S3992XA Unspecified injury of lower back, initial encounter: Secondary | ICD-10-CM | POA: Insufficient documentation

## 2014-09-15 DIAGNOSIS — I10 Essential (primary) hypertension: Secondary | ICD-10-CM | POA: Diagnosis not present

## 2014-09-15 DIAGNOSIS — S4992XA Unspecified injury of left shoulder and upper arm, initial encounter: Secondary | ICD-10-CM | POA: Diagnosis present

## 2014-09-15 DIAGNOSIS — M545 Low back pain, unspecified: Secondary | ICD-10-CM

## 2014-09-15 DIAGNOSIS — Z79899 Other long term (current) drug therapy: Secondary | ICD-10-CM | POA: Insufficient documentation

## 2014-09-15 DIAGNOSIS — Z7982 Long term (current) use of aspirin: Secondary | ICD-10-CM | POA: Insufficient documentation

## 2014-09-15 DIAGNOSIS — Z791 Long term (current) use of non-steroidal anti-inflammatories (NSAID): Secondary | ICD-10-CM | POA: Insufficient documentation

## 2014-09-15 DIAGNOSIS — Y9389 Activity, other specified: Secondary | ICD-10-CM | POA: Insufficient documentation

## 2014-09-15 DIAGNOSIS — Y998 Other external cause status: Secondary | ICD-10-CM | POA: Diagnosis not present

## 2014-09-15 MED ORDER — METHOCARBAMOL 500 MG PO TABS
500.0000 mg | ORAL_TABLET | Freq: Once | ORAL | Status: AC
  Administered 2014-09-15: 500 mg via ORAL
  Filled 2014-09-15: qty 1

## 2014-09-15 MED ORDER — IBUPROFEN 400 MG PO TABS
800.0000 mg | ORAL_TABLET | Freq: Once | ORAL | Status: AC
  Administered 2014-09-15: 800 mg via ORAL
  Filled 2014-09-15: qty 2

## 2014-09-15 MED ORDER — METHOCARBAMOL 500 MG PO TABS: 500.0000 mg | ORAL_TABLET | Freq: Two times a day (BID) | ORAL | Status: DC
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
## 2014-09-16 ENCOUNTER — Ambulatory Visit (INDEPENDENT_AMBULATORY_CARE_PROVIDER_SITE_OTHER): Payer: BLUE CROSS/BLUE SHIELD | Admitting: Family Medicine

## 2014-09-16 ENCOUNTER — Encounter: Payer: Self-pay | Admitting: Family Medicine

## 2014-09-16 DIAGNOSIS — M76829 Posterior tibial tendinitis, unspecified leg: Secondary | ICD-10-CM

## 2014-09-16 DIAGNOSIS — M6789 Other specified disorders of synovium and tendon, multiple sites: Secondary | ICD-10-CM | POA: Diagnosis not present

## 2014-09-16 NOTE — Progress Notes (Signed)
Patient was fitted for a : standard, cushioned, semi-rigid orthotic. The orthotic was heated and afterward the patient was in a seated position and the orthotic molded. The patient was positioned in subtalar neutral position and 10 degrees of ankle dorsiflexion in a non-weight bearing stance. After completion of molding, patient did have orthotic management which included instructions on acclimating to the orthotics, signs of ill fit as well as care for the orthotic.  I spent 61minutes with the patient fitting the orthotics, discussing proper care of the orthotics as well as how to transfer between shoes and signs of ill fit.   The blank was ground to a stable position for weight bearing. Size: 8 (Igli Active)  Base: Carbon fiber Additional Posting and Padding: The following postings were fitted onto the molded orthotics to help maintain a talar neutral position - Wedge posting for transverse arch:  250/35 on left side    Silicone posting for longitudinal arch:  250/100 x2 bilaterally  The patient ambulated these, and they were very comfortable and supportive.

## 2014-09-16 NOTE — Assessment & Plan Note (Signed)
Patient will slowly increase the wear over the course of time. We discussed if patient has pain with to do. Patient can call if any questions. Patient follow-up in 2-4 weeks for further evaluation and treatment.

## 2014-09-16 NOTE — Patient Instructions (Signed)

## 2014-09-18 ENCOUNTER — Encounter: Payer: Self-pay | Admitting: Internal Medicine

## 2014-09-18 ENCOUNTER — Ambulatory Visit (INDEPENDENT_AMBULATORY_CARE_PROVIDER_SITE_OTHER): Payer: BLUE CROSS/BLUE SHIELD | Admitting: Internal Medicine

## 2014-09-18 ENCOUNTER — Ambulatory Visit (INDEPENDENT_AMBULATORY_CARE_PROVIDER_SITE_OTHER)
Admission: RE | Admit: 2014-09-18 | Discharge: 2014-09-18 | Disposition: A | Discharge status: Home / Self Care | Payer: BLUE CROSS/BLUE SHIELD | Source: Ambulatory Visit | Attending: Internal Medicine | Admitting: Internal Medicine

## 2014-09-18 DIAGNOSIS — S161XXD Strain of muscle, fascia and tendon at neck level, subsequent encounter: Secondary | ICD-10-CM

## 2014-09-18 DIAGNOSIS — S39012D Strain of muscle, fascia and tendon of lower back, subsequent encounter: Secondary | ICD-10-CM

## 2014-09-18 DIAGNOSIS — S161XXA Strain of muscle, fascia and tendon at neck level, initial encounter: Secondary | ICD-10-CM | POA: Insufficient documentation

## 2014-09-18 MED ORDER — CYCLOBENZAPRINE HCL ER 15 MG PO CP24: 15.0000 mg | ORAL_CAPSULE | Freq: Every day | ORAL | Status: DC | PRN

## 2014-09-18 MED ORDER — TRAMADOL HCL 50 MG PO TABS: 50.0000 mg | ORAL_TABLET | Freq: Two times a day (BID) | ORAL | Status: DC | PRN

## 2014-09-18 NOTE — Assessment & Plan Note (Signed)
09/15/14 
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
## 2014-09-18 NOTE — Patient Instructions (Signed)
Rest
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
## 2014-09-18 NOTE — Progress Notes (Signed)
Pre visit review using our clinic review tool, if applicable. No additional management support is needed unless otherwise documented below in the visit note. 

## 2014-09-19 ENCOUNTER — Telehealth: Payer: Self-pay | Admitting: Internal Medicine

## 2014-09-19 NOTE — Telephone Encounter (Signed)
[facility] is calling to advise that cyclobenzaprine (AMRIX) 15 MG 24 hr capsule is not covered by insurance. She is asking for a verbal on regular cyclobenzaprine (thats not XR), which is covered by insurance.

## 2014-09-20 MED ORDER — CYCLOBENZAPRINE HCL 5 MG PO TABS: 5.0000 mg | ORAL_TABLET | Freq: Two times a day (BID) | ORAL | Status: DC | PRN

## 2014-09-20 NOTE — Telephone Encounter (Signed)
New rx sent to [facility]. See meds.

## 2014-09-20 NOTE — Telephone Encounter (Signed)
OK. Thx

## 2014-09-25 ENCOUNTER — Ambulatory Visit (INDEPENDENT_AMBULATORY_CARE_PROVIDER_SITE_OTHER): Payer: BLUE CROSS/BLUE SHIELD | Admitting: Internal Medicine

## 2014-09-25 ENCOUNTER — Encounter: Payer: Self-pay | Admitting: Internal Medicine

## 2014-09-25 VITALS — BP 168/92 | HR 89 | Wt 193.0 lb

## 2014-09-25 DIAGNOSIS — S161XXD Strain of muscle, fascia and tendon at neck level, subsequent encounter: Secondary | ICD-10-CM | POA: Diagnosis not present

## 2014-09-25 DIAGNOSIS — S39012D Strain of muscle, fascia and tendon of lower back, subsequent encounter: Secondary | ICD-10-CM | POA: Diagnosis not present

## 2014-09-25 NOTE — Assessment & Plan Note (Addendum)
Off work - form filled out RTC 2 wks
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
## 2014-09-25 NOTE — Assessment & Plan Note (Signed)
  Cerv spine X ray FINDINGS: Straightening of normal cervical lordosis. The vertebral body heights are well preserved. Multi level disc space narrowing and ventral endplate spurring is noted. This is most advanced at C5-6. No fractures or subluxations.  IMPRESSION: Degenerative disc disease. No acute findings.   Electronically Signed  By: Kerby Moors M.D.  On: 09/18/2014 15:59  Start PT Cont meds

## 2014-09-25 NOTE — Progress Notes (Signed)
Pre visit review using our clinic review tool, if applicable. No additional management support is needed unless otherwise documented below in the visit note. 

## 2014-10-03 LAB — HM MAMMOGRAPHY: HM Mammogram: NORMAL

## 2014-10-09 ENCOUNTER — Ambulatory Visit (INDEPENDENT_AMBULATORY_CARE_PROVIDER_SITE_OTHER): Payer: BLUE CROSS/BLUE SHIELD | Admitting: Internal Medicine

## 2014-10-09 ENCOUNTER — Telehealth: Payer: Self-pay

## 2014-10-09 ENCOUNTER — Encounter: Payer: Self-pay | Admitting: Internal Medicine

## 2014-10-09 VITALS — BP 140/84 | HR 90 | Ht 63.0 in | Wt 196.0 lb

## 2014-10-09 DIAGNOSIS — S161XXD Strain of muscle, fascia and tendon at neck level, subsequent encounter: Secondary | ICD-10-CM | POA: Diagnosis not present

## 2014-10-09 DIAGNOSIS — M25532 Pain in left wrist: Secondary | ICD-10-CM

## 2014-10-09 DIAGNOSIS — M25531 Pain in right wrist: Secondary | ICD-10-CM

## 2014-10-09 DIAGNOSIS — S39012D Strain of muscle, fascia and tendon of lower back, subsequent encounter: Secondary | ICD-10-CM

## 2014-10-09 DIAGNOSIS — I10 Essential (primary) hypertension: Secondary | ICD-10-CM

## 2014-10-09 NOTE — Assessment & Plan Note (Signed)
Starting PT tomorrow Forms filled out

## 2014-10-09 NOTE — Telephone Encounter (Signed)
Spoke with pt about mammogram. during OV today. Pt stated she had one done last week at [facility]. Will call to get records

## 2014-10-09 NOTE — Assessment & Plan Note (Signed)
States BP is nl at home usually  Norvasc, Spironolactone

## 2014-10-09 NOTE — Assessment & Plan Note (Signed)
Starting PT tomorrow Out of work
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
## 2014-10-09 NOTE — Assessment & Plan Note (Signed)
Starting PT tomorrow

## 2014-10-09 NOTE — Patient Instructions (Signed)
Stay off work

## 2014-10-09 NOTE — Progress Notes (Signed)
Pre visit review using our clinic review tool, if applicable. No additional management support is needed unless otherwise documented below in the visit note. 

## 2014-10-30 ENCOUNTER — Ambulatory Visit: Payer: BLUE CROSS/BLUE SHIELD | Admitting: Internal Medicine

## 2014-11-04 ENCOUNTER — Encounter: Payer: Self-pay | Admitting: *Deleted

## 2014-11-04 ENCOUNTER — Encounter: Payer: Self-pay | Admitting: Internal Medicine

## 2014-11-04 ENCOUNTER — Ambulatory Visit (INDEPENDENT_AMBULATORY_CARE_PROVIDER_SITE_OTHER): Payer: BLUE CROSS/BLUE SHIELD | Admitting: Internal Medicine

## 2014-11-04 VITALS — BP 144/90 | HR 83 | Wt 203.0 lb

## 2014-11-04 DIAGNOSIS — M25531 Pain in right wrist: Secondary | ICD-10-CM

## 2014-11-04 DIAGNOSIS — S161XXS Strain of muscle, fascia and tendon at neck level, sequela: Secondary | ICD-10-CM

## 2014-11-04 DIAGNOSIS — S39012S Strain of muscle, fascia and tendon of lower back, sequela: Secondary | ICD-10-CM

## 2014-11-04 DIAGNOSIS — M25532 Pain in left wrist: Secondary | ICD-10-CM

## 2014-11-04 NOTE — Assessment & Plan Note (Signed)
In PT To work on 11/13/14

## 2014-11-04 NOTE — Progress Notes (Signed)
Pre visit review using our clinic review tool, if applicable. No additional management support is needed unless otherwise documented below in the visit note. 
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
## 2014-11-04 NOTE — Assessment & Plan Note (Signed)
In PT To work on 11/13/14 Letter Form filled out

## 2014-11-04 NOTE — Assessment & Plan Note (Addendum)
In PT To work on 11/13/14 Letter Form filled out

## 2014-11-07 ENCOUNTER — Telehealth: Payer: Self-pay
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
## 2014-11-15 ENCOUNTER — Encounter: Payer: Self-pay | Admitting: Internal Medicine

## 2014-11-27 ENCOUNTER — Telehealth: Payer: Self-pay | Admitting: Internal Medicine

## 2014-11-27 NOTE — Telephone Encounter (Signed)
Pt called in and said that her employer faxed over a few forms they were needing filled out.  Have you seen any form for this pt and if so do you know the status of them?    They need to be faxed to: [employer] [facility]  Fax number [phone]

## 2014-11-27 NOTE — Telephone Encounter (Signed)
I do not have these forms currently. Do you have them in your red or blue folders?

## 2014-11-29 NOTE — Telephone Encounter (Signed)
Allison Harrison, have you seen the forms for [employer]?

## 2014-11-29 NOTE — Telephone Encounter (Signed)
No.Thx.

## 2014-12-16 ENCOUNTER — Encounter: Payer: Self-pay | Admitting: Internal Medicine

## 2014-12-16 ENCOUNTER — Ambulatory Visit (INDEPENDENT_AMBULATORY_CARE_PROVIDER_SITE_OTHER): Payer: BLUE CROSS/BLUE SHIELD | Admitting: Internal Medicine

## 2014-12-16 VITALS — BP 140/82 | HR 82 | Wt 197.0 lb

## 2014-12-16 DIAGNOSIS — I10 Essential (primary) hypertension: Secondary | ICD-10-CM

## 2014-12-16 DIAGNOSIS — S39012S Strain of muscle, fascia and tendon of lower back, sequela: Secondary | ICD-10-CM

## 2014-12-16 DIAGNOSIS — S161XXS Strain of muscle, fascia and tendon at neck level, sequela: Secondary | ICD-10-CM | POA: Diagnosis not present

## 2014-12-16 DIAGNOSIS — D485 Neoplasm of uncertain behavior of skin: Secondary | ICD-10-CM

## 2014-12-16 MED ORDER — TRAMADOL HCL 50 MG PO TABS: 50.0000 mg | ORAL_TABLET | Freq: Two times a day (BID) | ORAL | Status: DC | PRN
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
## 2014-12-16 NOTE — Progress Notes (Signed)
Pre visit review using our clinic review tool, if applicable. No additional management support is needed unless otherwise documented below in the visit note. 
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
## 2014-12-16 NOTE — Patient Instructions (Signed)
Moles Moles are usually harmless growths on the skin. They are accumulations of color (pigment) cells in the skin that:   Can be various colors, from light brown to black.  Can appear anywhere on the body.  May remain flat or become raised.  May contain hairs.  May remain smooth or develop wrinkling. Most moles are not cancerous (benign). However, some moles may develop changes and become cancerous. It is important to check your moles every month. If you check your moles regularly, you will be able to notice any changes that may occur.  CAUSES  Moles occur when skin cells grow together in clusters instead of spreading out in the skin as they normally do. The reason for this clustering is unknown. DIAGNOSIS  Your caregiver will perform a skin examination to diagnose your mole.  TREATMENT  Moles usually do not require treatment. If a mole becomes worrisome, your caregiver may choose to take a sample of the mole or remove it entirely, and then send it to a lab for examination.  HOME CARE INSTRUCTIONS  Check your mole(s) monthly for changes that may indicate skin cancer. These changes can include:  A change in size.  A change in color. Note that moles tend to darken during pregnancy or when taking birth control pills (oral contraception).  A change in shape.  A change in the border of the mole.  Wear sunscreen (with an SPF of at least 30) when you spend long periods of time outside. Reapply the sunscreen every 2-3 hours.  Schedule annual appointments with your skin doctor (dermatologist) if you have a large number of moles. SEEK MEDICAL CARE IF:  Your mole changes size, especially if it becomes larger than a pencil eraser.  Your mole changes in color or develops more than one color.  Your mole becomes itchy or bleeds.  Your mole, or the skin near the mole, becomes painful, sore, red, or swollen.  Your mole becomes scaly, sheds skin, or oozes fluid.  Your mole develops  irregular borders.  Your mole becomes flat or develops raised areas.  Your mole becomes hard or soft. Document Released: 12/08/2000 Document Revised: 12/08/2011 Document Reviewed: 09/27/2011 Exit[employer] Patient Information 2015 [employer]. This information is not intended to replace advice given to you by your health care Yug Loria. Make sure you discuss any questions you have with your health care Bee Hammerschmidt.  

## 2014-12-16 NOTE — Assessment & Plan Note (Signed)
Cont Rx 

## 2014-12-16 NOTE — Assessment & Plan Note (Addendum)
L proc palm mole 6x3 mm 2016 Bx suggested - pt declined

## 2015-01-06 ENCOUNTER — Ambulatory Visit: Payer: BLUE CROSS/BLUE SHIELD | Admitting: Internal Medicine

## 2015-02-17 ENCOUNTER — Ambulatory Visit: Payer: BLUE CROSS/BLUE SHIELD | Admitting: Internal Medicine

## 2015-03-07 ENCOUNTER — Encounter: Payer: Self-pay | Admitting: Internal Medicine

## 2015-03-07 ENCOUNTER — Ambulatory Visit (INDEPENDENT_AMBULATORY_CARE_PROVIDER_SITE_OTHER): Payer: BLUE CROSS/BLUE SHIELD | Admitting: Internal Medicine

## 2015-03-07 VITALS — BP 148/90 | HR 65 | Wt 192.0 lb

## 2015-03-07 DIAGNOSIS — E669 Obesity, unspecified: Secondary | ICD-10-CM

## 2015-03-07 DIAGNOSIS — E785 Hyperlipidemia, unspecified: Secondary | ICD-10-CM

## 2015-03-07 DIAGNOSIS — S39012S Strain of muscle, fascia and tendon of lower back, sequela: Secondary | ICD-10-CM

## 2015-03-07 DIAGNOSIS — I10 Essential (primary) hypertension: Secondary | ICD-10-CM | POA: Diagnosis not present

## 2015-03-07 DIAGNOSIS — D485 Neoplasm of uncertain behavior of skin: Secondary | ICD-10-CM

## 2015-03-07 DIAGNOSIS — S161XXS Strain of muscle, fascia and tendon at neck level, sequela: Secondary | ICD-10-CM

## 2015-03-07 DIAGNOSIS — R739 Hyperglycemia, unspecified: Secondary | ICD-10-CM | POA: Diagnosis not present

## 2015-03-07 NOTE — Assessment & Plan Note (Addendum)
Labs On diet 

## 2015-03-07 NOTE — Assessment & Plan Note (Signed)
L proc palm mole 6x3 mm 2016 Bx suggested

## 2015-03-07 NOTE — Assessment & Plan Note (Signed)
Better  

## 2015-03-07 NOTE — Assessment & Plan Note (Addendum)
Better In PT 
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
## 2015-03-07 NOTE — Assessment & Plan Note (Signed)
Norvasc, Spironolactone Pt lost wt

## 2015-03-07 NOTE — Assessment & Plan Note (Signed)
Labs

## 2015-03-07 NOTE — Assessment & Plan Note (Addendum)
Recurrent sx's In PT

## 2015-03-07 NOTE — Progress Notes (Signed)
Pre visit review using our clinic review tool, if applicable. No additional management support is needed unless otherwise documented below in the visit note. 
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
## 2015-05-07 ENCOUNTER — Ambulatory Visit: Payer: BLUE CROSS/BLUE SHIELD | Admitting: Internal Medicine

## 2015-05-09 ENCOUNTER — Encounter: Payer: Self-pay | Admitting: Internal Medicine

## 2015-05-09 ENCOUNTER — Ambulatory Visit (INDEPENDENT_AMBULATORY_CARE_PROVIDER_SITE_OTHER): Payer: BLUE CROSS/BLUE SHIELD | Admitting: Internal Medicine

## 2015-05-09 VITALS — BP 148/92 | HR 69 | Temp 97.1°F | Ht 63.0 in | Wt 197.0 lb

## 2015-05-09 DIAGNOSIS — I10 Essential (primary) hypertension: Secondary | ICD-10-CM | POA: Diagnosis not present

## 2015-05-09 DIAGNOSIS — Z23 Encounter for immunization: Secondary | ICD-10-CM

## 2015-05-09 DIAGNOSIS — S39012S Strain of muscle, fascia and tendon of lower back, sequela: Secondary | ICD-10-CM | POA: Diagnosis not present

## 2015-05-09 DIAGNOSIS — S161XXS Strain of muscle, fascia and tendon at neck level, sequela: Secondary | ICD-10-CM

## 2015-05-09 DIAGNOSIS — Z0000 Encounter for general adult medical examination without abnormal findings: Secondary | ICD-10-CM

## 2015-05-09 MED ORDER — SPIRONOLACTONE-HCTZ 50-50 MG PO TABS: 1.0000 | ORAL_TABLET | Freq: Every day | ORAL | Status: DC

## 2015-05-09 NOTE — Progress Notes (Signed)
Pre visit review using our clinic review tool, if applicable. No additional management support is needed unless otherwise documented below in the visit note. 

## 2015-05-09 NOTE — Assessment & Plan Note (Signed)
Pain is 90% better [employer] PT

## 2015-05-09 NOTE — Assessment & Plan Note (Signed)
Norvasc. Stop spironolactone, start Spironolactone-HCTZ

## 2015-05-09 NOTE — Assessment & Plan Note (Signed)
90% better [employer] PT
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
## 2015-05-30 ENCOUNTER — Telehealth: Payer: Self-pay | Admitting: *Deleted

## 2015-05-30 MED ORDER — SPIRONOLACTONE 50 MG PO TABS: 50.0000 mg | ORAL_TABLET | Freq: Every day | ORAL | Status: DC

## 2015-05-30 MED ORDER — HYDROCHLOROTHIAZIDE 50 MG PO TABS: 50.0000 mg | ORAL_TABLET | Freq: Every day | ORAL | Status: DC

## 2015-05-30 NOTE — Telephone Encounter (Signed)
Pharmacy left msg on triage stating MD rx Aldactazide 50/50 for pt, but med cost $60 for 30 day. Trying to see if rx can be separate, and the cost will not be that much...Allison Harrison Sent spironolactone 50 mg & HCTZ 50mg ...Allison Harrison

## 2015-06-18 ENCOUNTER — Ambulatory Visit (INDEPENDENT_AMBULATORY_CARE_PROVIDER_SITE_OTHER): Payer: BLUE CROSS/BLUE SHIELD | Admitting: Internal Medicine

## 2015-06-18 ENCOUNTER — Encounter: Payer: Self-pay | Admitting: Internal Medicine

## 2015-06-18 VITALS — BP 150/88 | HR 72 | Wt 195.0 lb

## 2015-06-18 DIAGNOSIS — M25561 Pain in right knee: Secondary | ICD-10-CM

## 2015-06-18 MED ORDER — MELOXICAM 7.5 MG PO TABS: 7.5000 mg | ORAL_TABLET | Freq: Every day | ORAL | Status: DC
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
## 2015-06-18 NOTE — Assessment & Plan Note (Signed)
Consult Dr Berenice Primas 3/17 R knee injury Mobic rx qd pc

## 2015-06-18 NOTE — Progress Notes (Signed)
Pre visit review using our clinic review tool, if applicable. No additional management support is needed unless otherwise documented below in the visit note. 

## 2015-07-14 ENCOUNTER — Other Ambulatory Visit: Payer: Self-pay | Admitting: Internal Medicine

## 2015-07-16 ENCOUNTER — Ambulatory Visit: Payer: BLUE CROSS/BLUE SHIELD | Admitting: Internal Medicine

## 2015-08-06 ENCOUNTER — Encounter: Payer: BLUE CROSS/BLUE SHIELD | Admitting: Internal Medicine

## 2015-09-08 ENCOUNTER — Other Ambulatory Visit (INDEPENDENT_AMBULATORY_CARE_PROVIDER_SITE_OTHER): Payer: BLUE CROSS/BLUE SHIELD

## 2015-09-08 ENCOUNTER — Ambulatory Visit (INDEPENDENT_AMBULATORY_CARE_PROVIDER_SITE_OTHER): Payer: BLUE CROSS/BLUE SHIELD | Admitting: Internal Medicine

## 2015-09-08 ENCOUNTER — Encounter: Payer: Self-pay | Admitting: Internal Medicine

## 2015-09-08 VITALS — BP 160/100 | HR 112 | Wt 192.0 lb

## 2015-09-08 DIAGNOSIS — R739 Hyperglycemia, unspecified: Secondary | ICD-10-CM

## 2015-09-08 DIAGNOSIS — E119 Type 2 diabetes mellitus without complications: Secondary | ICD-10-CM | POA: Diagnosis not present

## 2015-09-08 LAB — BASIC METABOLIC PANEL
BUN: 18 mg/dL (ref 6–23)
CHLORIDE: 90 meq/L — AB (ref 96–112)
CO2: 30 mEq/L (ref 19–32)
Calcium: 10.8 mg/dL — ABNORMAL HIGH (ref 8.4–10.5)
Creatinine, Ser: 1.12 mg/dL (ref 0.40–1.20)
GFR: 65.15 mL/min (ref >60.00)
Glucose, Bld: 372 mg/dL — ABNORMAL HIGH (ref 70–99)
POTASSIUM: 3.8 meq/L (ref 3.5–5.1)
SODIUM: 135 meq/L (ref 135–145)

## 2015-09-08 LAB — HEMOGLOBIN A1C: HEMOGLOBIN A1C: 11.6 % — AB (ref 4.6–6.5)

## 2015-09-08 MED ORDER — GLUCOSE BLOOD VI STRP: ORAL_STRIP | Status: DC

## 2015-09-08 MED ORDER — INSULIN PEN NEEDLE 31G X 5 MM MISC: 1.0000 [IU] | Freq: Every day | Status: DC

## 2015-09-08 MED ORDER — METFORMIN HCL 500 MG PO TABS: 500.0000 mg | ORAL_TABLET | Freq: Every day | ORAL | Status: DC

## 2015-09-08 MED ORDER — BASAGLAR KWIKPEN 100 UNIT/ML ~~LOC~~ SOPN: 8.0000 [IU] | PEN_INJECTOR | Freq: Every day | SUBCUTANEOUS | Status: DC

## 2015-09-08 MED ORDER — ONETOUCH DELICA LANCETS FINE MISC: 1.0000 | Freq: Every day | Status: AC | PRN

## 2015-09-08 NOTE — Assessment & Plan Note (Addendum)
CBG 404 today Start Basaglar 8 units/d -- Demo/info Start Metformin in 2 days
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
## 2015-09-08 NOTE — Progress Notes (Signed)
Pre visit review using our clinic review tool, if applicable. No additional management support is needed unless otherwise documented below in the visit note. 

## 2015-09-08 NOTE — Patient Instructions (Signed)

## 2015-09-15 ENCOUNTER — Ambulatory Visit (INDEPENDENT_AMBULATORY_CARE_PROVIDER_SITE_OTHER): Payer: BLUE CROSS/BLUE SHIELD | Admitting: Internal Medicine

## 2015-09-15 ENCOUNTER — Encounter: Payer: Self-pay | Admitting: Internal Medicine

## 2015-09-15 VITALS — BP 140/80 | HR 97 | Wt 189.0 lb

## 2015-09-15 DIAGNOSIS — E119 Type 2 diabetes mellitus without complications: Secondary | ICD-10-CM

## 2015-09-15 DIAGNOSIS — I10 Essential (primary) hypertension: Secondary | ICD-10-CM | POA: Diagnosis not present

## 2015-09-15 MED ORDER — BASAGLAR KWIKPEN 100 UNIT/ML ~~LOC~~ SOPN: 12.0000 [IU] | PEN_INJECTOR | Freq: Every day | SUBCUTANEOUS | Status: DC

## 2015-09-15 NOTE — Assessment & Plan Note (Signed)
Norvasc, Spironolactone-HCTZ

## 2015-09-15 NOTE — Progress Notes (Signed)
Pre visit review using our clinic review tool, if applicable. No additional management support is needed unless otherwise documented below in the visit note. 

## 2015-09-15 NOTE — Patient Instructions (Addendum)
Increase Insulin by 2 units every day for goal sugars 100-150  Take 4 more units tonight  Start 12 units a day tomorrow morning
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
## 2015-09-15 NOTE — Assessment & Plan Note (Addendum)
6/17 On Basiglar and Metformin Will titrate up Basiglar - increase Insulin by 2 units every day for goal sugars 100-150 Endocr ref

## 2015-09-23 ENCOUNTER — Telehealth: Payer: Self-pay | Admitting: *Deleted

## 2015-09-23 MED ORDER — METFORMIN HCL 500 MG PO TABS: 500.0000 mg | ORAL_TABLET | Freq: Two times a day (BID) | ORAL | Status: DC

## 2015-09-23 MED ORDER — GLUCOSE BLOOD VI STRP: 1.0000 | ORAL_STRIP | Freq: Three times a day (TID) | Status: DC

## 2015-09-23 MED ORDER — METFORMIN HCL 500 MG PO TABS: 500.0000 mg | ORAL_TABLET | Freq: Every day | ORAL | Status: DC

## 2015-09-23 MED ORDER — GLUCOSE BLOOD VI STRP
ORAL_STRIP | Status: DC
Start: 2015-09-23 — End: 2015-09-23

## 2015-09-23 NOTE — Telephone Encounter (Signed)
Pharmacy calling on behalf of patient. Pt states she was advised to increase Metformin to bid AND her testing strips states "as directed" she needs sig to state tid testing. [place] per PCP. New Rxs sent. See meds.

## 2015-10-15 ENCOUNTER — Ambulatory Visit (INDEPENDENT_AMBULATORY_CARE_PROVIDER_SITE_OTHER): Payer: BLUE CROSS/BLUE SHIELD | Admitting: Internal Medicine

## 2015-10-15 ENCOUNTER — Encounter: Payer: Self-pay | Admitting: Internal Medicine

## 2015-10-15 VITALS — BP 140/89 | HR 85 | Wt 188.0 lb

## 2015-10-15 DIAGNOSIS — E669 Obesity, unspecified: Secondary | ICD-10-CM | POA: Diagnosis not present

## 2015-10-15 DIAGNOSIS — I10 Essential (primary) hypertension: Secondary | ICD-10-CM

## 2015-10-15 DIAGNOSIS — S39012S Strain of muscle, fascia and tendon of lower back, sequela: Secondary | ICD-10-CM

## 2015-10-15 DIAGNOSIS — E119 Type 2 diabetes mellitus without complications: Secondary | ICD-10-CM | POA: Diagnosis not present

## 2015-10-15 DIAGNOSIS — E785 Hyperlipidemia, unspecified: Secondary | ICD-10-CM | POA: Diagnosis not present

## 2015-10-15 DIAGNOSIS — Z0000 Encounter for general adult medical examination without abnormal findings: Secondary | ICD-10-CM

## 2015-10-15 NOTE — Assessment & Plan Note (Signed)
  On diet  

## 2015-10-15 NOTE — Assessment & Plan Note (Signed)
Trying to loose wt

## 2015-10-15 NOTE — Assessment & Plan Note (Addendum)
The pt stopped Basiglar 2 wks ago. CBGs are 100-140 On Metformin

## 2015-10-15 NOTE — Progress Notes (Signed)
Pre visit review using our clinic review tool, if applicable. No additional management support is needed unless otherwise documented below in the visit note. 
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
## 2015-10-15 NOTE — Assessment & Plan Note (Signed)
meloxicam prn 

## 2015-10-15 NOTE — Assessment & Plan Note (Signed)
Norvasc, Spironolactone-HCTZ

## 2015-10-21 ENCOUNTER — Encounter: Payer: Self-pay | Admitting: Internal Medicine

## 2015-11-25 ENCOUNTER — Other Ambulatory Visit: Payer: Self-pay | Admitting: *Deleted

## 2015-11-25 MED ORDER — GLUCOSE BLOOD VI STRP: 1.0000 | ORAL_STRIP | Freq: Two times a day (BID) | 3 refills | Status: AC

## 2015-11-26 ENCOUNTER — Other Ambulatory Visit: Payer: Self-pay | Admitting: Internal Medicine

## 2016-01-08 ENCOUNTER — Other Ambulatory Visit (INDEPENDENT_AMBULATORY_CARE_PROVIDER_SITE_OTHER): Payer: BLUE CROSS/BLUE SHIELD

## 2016-01-08 DIAGNOSIS — Z0000 Encounter for general adult medical examination without abnormal findings: Secondary | ICD-10-CM | POA: Diagnosis not present

## 2016-01-08 DIAGNOSIS — E119 Type 2 diabetes mellitus without complications: Secondary | ICD-10-CM

## 2016-01-08 LAB — URINALYSIS
Bilirubin Urine: NEGATIVE
Hgb urine dipstick: NEGATIVE
KETONES UR: NEGATIVE
Leukocytes, UA: NEGATIVE
Nitrite: NEGATIVE
PH: 8.5 — AB (ref 5.0–8.0)
SPECIFIC GRAVITY, URINE: 1.015 (ref 1.000–1.030)
TOTAL PROTEIN, URINE-UPE24: NEGATIVE
URINE GLUCOSE: NEGATIVE
UROBILINOGEN UA: 0.2 (ref 0.0–1.0)

## 2016-01-08 LAB — CBC WITH DIFFERENTIAL/PLATELET
BASOS ABS: 0 10*3/uL (ref 0.0–0.1)
BASOS PCT: 0.4 % (ref 0.0–3.0)
Eosinophils Absolute: 0.2 10*3/uL (ref 0.0–0.7)
Eosinophils Relative: 4.8 % (ref 0.0–5.0)
HCT: 37.8 % (ref 36.0–46.0)
Hemoglobin: 12.6 g/dL (ref 12.0–15.0)
LYMPHS ABS: 1.4 10*3/uL (ref 0.7–4.0)
Lymphocytes Relative: 28.7 % (ref 12.0–46.0)
MCHC: 33.2 g/dL (ref 30.0–36.0)
MCV: 87.5 fl (ref 78.0–100.0)
MONOS PCT: 8.5 % (ref 3.0–12.0)
Monocytes Absolute: 0.4 10*3/uL (ref 0.1–1.0)
NEUTROS ABS: 2.7 10*3/uL (ref 1.4–7.7)
NEUTROS PCT: 57.6 % (ref 43.0–77.0)
PLATELETS: 320 10*3/uL (ref 150.0–400.0)
RBC: 4.32 Mil/uL (ref 3.87–5.11)
RDW: 13.5 % (ref 11.5–15.5)
WBC: 4.7 10*3/uL (ref 4.0–10.5)

## 2016-01-08 LAB — MICROALBUMIN / CREATININE URINE RATIO
Creatinine,U: 170.8 mg/dL
MICROALB UR: 0.7 mg/dL (ref 0.0–1.9)
Microalb Creat Ratio: 0.4 mg/g (ref 0.0–30.0)

## 2016-01-08 LAB — HEMOGLOBIN A1C: HEMOGLOBIN A1C: 6.7 % — AB (ref 4.6–6.5)

## 2016-01-08 LAB — BASIC METABOLIC PANEL
BUN: 18 mg/dL (ref 6–23)
CALCIUM: 10 mg/dL (ref 8.4–10.5)
CO2: 29 mEq/L (ref 19–32)
Chloride: 101 mEq/L (ref 96–112)
Creatinine, Ser: 0.91 mg/dL (ref 0.40–1.20)
GFR: 82.69 mL/min (ref >60.00)
GLUCOSE: 113 mg/dL — AB (ref 70–99)
POTASSIUM: 3.8 meq/L (ref 3.5–5.1)
SODIUM: 139 meq/L (ref 135–145)

## 2016-01-08 LAB — LIPID PANEL
CHOL/HDL RATIO: 5
CHOLESTEROL: 231 mg/dL — AB (ref 0–200)
HDL: 50.5 mg/dL (ref >39.00)
LDL Cholesterol: 167 mg/dL — ABNORMAL HIGH (ref 0–99)
NonHDL: 180.52
TRIGLYCERIDES: 69 mg/dL (ref 0.0–149.0)
VLDL: 13.8 mg/dL (ref 0.0–40.0)

## 2016-01-08 LAB — HEPATIC FUNCTION PANEL
ALK PHOS: 62 U/L (ref 39–117)
ALT: 14 U/L (ref 0–35)
AST: 20 U/L (ref 0–37)
Albumin: 4.1 g/dL (ref 3.5–5.2)
BILIRUBIN DIRECT: 0 mg/dL (ref 0.0–0.3)
BILIRUBIN TOTAL: 0.3 mg/dL (ref 0.2–1.2)
TOTAL PROTEIN: 7.7 g/dL (ref 6.0–8.3)

## 2016-01-08 LAB — TSH: TSH: 0.98 u[IU]/mL (ref 0.35–4.50)

## 2016-01-09 LAB — HEPATITIS C ANTIBODY: HCV Ab: NEGATIVE

## 2016-01-16 ENCOUNTER — Ambulatory Visit (INDEPENDENT_AMBULATORY_CARE_PROVIDER_SITE_OTHER): Payer: BLUE CROSS/BLUE SHIELD | Admitting: Internal Medicine

## 2016-01-16 ENCOUNTER — Encounter: Payer: Self-pay | Admitting: Internal Medicine

## 2016-01-16 DIAGNOSIS — Z0000 Encounter for general adult medical examination without abnormal findings: Secondary | ICD-10-CM

## 2016-01-16 DIAGNOSIS — I10 Essential (primary) hypertension: Secondary | ICD-10-CM | POA: Diagnosis not present

## 2016-01-16 DIAGNOSIS — E119 Type 2 diabetes mellitus without complications: Secondary | ICD-10-CM

## 2016-01-16 DIAGNOSIS — B351 Tinea unguium: Secondary | ICD-10-CM

## 2016-01-16 MED ORDER — CICLOPIROX 8 % EX SOLN: Freq: Every day | CUTANEOUS | 0 refills | Status: DC

## 2016-01-16 NOTE — Patient Instructions (Signed)
Hemoglobin A1c Test Some of the sugar (glucose) that circulates in your blood sticks or binds to blood proteins. Hemoglobin (Hb or Hgb) is one type of blood protein that glucose binds to. It also carries oxygen in the red blood cells (RBCs). When glucose binds to Hb, the glucose-coated Hb is called glycated Hb. Once Hb is glycated, it remains that way for the life of the RBC. This is about 120 days. Rather than testing your blood glucose level on one single day, the hemoglobin A1c (HbA1c) test measures the average amount of glycated hemoglobin and, therefore, the average amount of glucose in your blood during the 3-4 months just before the test is done. The HbA1c test is used to monitor long-term control of blood sugar in people who have diabetes mellitus. The HbA1c test can also be used in addition to or in combination with fasting blood glucose level and oral glucose tolerance tests. RESULTS It is your responsibility to obtain your test results. Ask the lab or department performing the test when and how you will get your results. Contact your health care provider to discuss any questions you have about your results. Range of Normal Values Ranges for normal values may vary among different labs and hospitals. You should always check with your health care provider after having lab work or other tests done to discuss the meaning of your test results and whether your values are considered within normal limits. The ranges for normal HbA1c test results are as follows:  Adult or child without diabetes: 4-5.9%.  Adult or child with diabetes and good blood glucose control: less than 6.5%. Several factors can affect HbA1c test results. These may include:  Diseases (hemoglobinopathies) that cause a change in the shape, size, or amount of Hb in your blood.  Longer than normal RBC life span.  Abnormally low levels of certain proteins in your blood.  Eating foods or taking supplements that are high in vitamin  C (ascorbic acid). Meaning of Results Outside Normal Value Ranges Abnormally high HbA1c values are most commonly an indication of prediabetes mellitus and diabetes mellitus:  An HbA1c result of 5.7-6.4% is considered diagnostic of prediabetes mellitus.  An HbA1c result of 6.5% or higher on two separate occasions is considered diagnostic of diabetes mellitus. Abnormally low HbA1c values can be caused by several health conditions. These may include:  Pregnancy.  A large amount of blood loss.  Blood transfusions.  Low red blood cell count (anemia). This is caused by premature destruction of red blood cells.  Long-term kidney failure.  Some unusual forms of Hb (Hb variants), such as sickle cell trait. Discuss your test results with your health care provider. He or she will use the results to make a diagnosis and determine a treatment plan that is right for you.   This information is not intended to replace advice given to you by your health care provider. Make sure you discuss any questions you have with your health care provider.   Document Released: 04/06/2004 Document Revised: 04/05/2014 Document Reviewed: 07/30/2013 Elsevier Interactive Patient Education [employer].

## 2016-01-16 NOTE — Progress Notes (Signed)
Pre visit review using our clinic review tool, if applicable. No additional management support is needed unless otherwise documented below in the visit note. 

## 2016-01-16 NOTE — Assessment & Plan Note (Signed)
Norvasc, Spironolactone May need to add ACE/ARB

## 2016-01-16 NOTE — Assessment & Plan Note (Signed)
[place]

## 2016-01-16 NOTE — Assessment & Plan Note (Signed)
We discussed age appropriate health related issues, including available/recomended screening tests and vaccinations. We discussed a need for adhering to healthy diet and exercise. Labs/EKG were reviewed/ordered. All questions were answered.   

## 2016-01-16 NOTE — Assessment & Plan Note (Signed)
ACE and statins need discussed Better on Metformin alone

## 2016-02-03 ENCOUNTER — Other Ambulatory Visit: Payer: Self-pay | Admitting: Internal Medicine

## 2016-05-14 ENCOUNTER — Other Ambulatory Visit (INDEPENDENT_AMBULATORY_CARE_PROVIDER_SITE_OTHER): Payer: BLUE CROSS/BLUE SHIELD

## 2016-05-14 ENCOUNTER — Ambulatory Visit: Payer: BLUE CROSS/BLUE SHIELD | Admitting: Internal Medicine

## 2016-05-14 DIAGNOSIS — E119 Type 2 diabetes mellitus without complications: Secondary | ICD-10-CM | POA: Diagnosis not present

## 2016-05-14 LAB — BASIC METABOLIC PANEL
BUN: 16 mg/dL (ref 6–23)
CHLORIDE: 103 meq/L (ref 96–112)
CO2: 27 mEq/L (ref 19–32)
Calcium: 9.6 mg/dL (ref 8.4–10.5)
Creatinine, Ser: 0.95 mg/dL (ref 0.40–1.20)
GFR: 78.59 mL/min (ref >60.00)
GLUCOSE: 105 mg/dL — AB (ref 70–99)
POTASSIUM: 3.7 meq/L (ref 3.5–5.1)
Sodium: 138 mEq/L (ref 135–145)

## 2016-05-14 LAB — HEMOGLOBIN A1C: Hgb A1c MFr Bld: 6.7 % — ABNORMAL HIGH (ref 4.6–6.5)

## 2016-05-19 ENCOUNTER — Encounter: Payer: Self-pay | Admitting: Internal Medicine

## 2016-05-19 ENCOUNTER — Ambulatory Visit (INDEPENDENT_AMBULATORY_CARE_PROVIDER_SITE_OTHER): Payer: BLUE CROSS/BLUE SHIELD | Admitting: Internal Medicine

## 2016-05-19 VITALS — BP 146/96 | HR 67 | Temp 98.1°F | Resp 16 | Ht 63.0 in | Wt 174.2 lb

## 2016-05-19 DIAGNOSIS — E6609 Other obesity due to excess calories: Secondary | ICD-10-CM | POA: Diagnosis not present

## 2016-05-19 DIAGNOSIS — Z6831 Body mass index (BMI) 31.0-31.9, adult: Secondary | ICD-10-CM

## 2016-05-19 DIAGNOSIS — E119 Type 2 diabetes mellitus without complications: Secondary | ICD-10-CM | POA: Diagnosis not present

## 2016-05-19 DIAGNOSIS — I10 Essential (primary) hypertension: Secondary | ICD-10-CM

## 2016-05-19 DIAGNOSIS — E785 Hyperlipidemia, unspecified: Secondary | ICD-10-CM
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
## 2016-05-19 NOTE — Progress Notes (Signed)
Pre-visit discussion using our clinic review tool. No additional management support is needed unless otherwise documented below in the visit note.  

## 2016-05-19 NOTE — Assessment & Plan Note (Signed)
Declined statins. 

## 2016-05-19 NOTE — Assessment & Plan Note (Signed)
Spironolactone and Norvasc

## 2016-05-19 NOTE — Assessment & Plan Note (Signed)
On Metformin only A1c 6.7%

## 2016-05-19 NOTE — Assessment & Plan Note (Signed)
Wt Readings from Last 3 Encounters:  05/19/16 174 lb 4 oz (79 kg)  01/16/16 176 lb (79.8 kg)  10/15/15 188 lb (85.3 kg)

## 2016-06-06 ENCOUNTER — Other Ambulatory Visit: Payer: Self-pay | Admitting: Internal Medicine

## 2016-08-01 ENCOUNTER — Other Ambulatory Visit: Payer: Self-pay | Admitting: Internal Medicine

## 2016-08-07 ENCOUNTER — Other Ambulatory Visit: Payer: Self-pay | Admitting: Internal Medicine

## 2016-08-25 LAB — HM DIABETES EYE EXAM

## 2016-08-31 ENCOUNTER — Encounter: Payer: Self-pay | Admitting: Internal Medicine

## 2016-09-07 ENCOUNTER — Encounter: Payer: Self-pay | Admitting: Internal Medicine

## 2016-09-07 NOTE — Progress Notes (Unsigned)
Results entered and sent to scan  

## 2016-09-20 ENCOUNTER — Ambulatory Visit: Payer: BLUE CROSS/BLUE SHIELD | Admitting: Internal Medicine

## 2016-10-10 ENCOUNTER — Other Ambulatory Visit: Payer: Self-pay | Admitting: Internal Medicine

## 2016-10-25 ENCOUNTER — Ambulatory Visit: Payer: BLUE CROSS/BLUE SHIELD | Admitting: Internal Medicine

## 2016-11-15 DIAGNOSIS — R52 Pain, unspecified: Secondary | ICD-10-CM | POA: Insufficient documentation

## 2016-11-16 ENCOUNTER — Ambulatory Visit (INDEPENDENT_AMBULATORY_CARE_PROVIDER_SITE_OTHER): Payer: BLUE CROSS/BLUE SHIELD | Admitting: Internal Medicine

## 2016-11-16 ENCOUNTER — Encounter: Payer: Self-pay | Admitting: Internal Medicine

## 2016-11-16 DIAGNOSIS — E6609 Other obesity due to excess calories: Secondary | ICD-10-CM | POA: Diagnosis not present

## 2016-11-16 DIAGNOSIS — E876 Hypokalemia: Secondary | ICD-10-CM | POA: Diagnosis not present

## 2016-11-16 DIAGNOSIS — Z6831 Body mass index (BMI) 31.0-31.9, adult: Secondary | ICD-10-CM

## 2016-11-16 DIAGNOSIS — I10 Essential (primary) hypertension: Secondary | ICD-10-CM | POA: Diagnosis not present

## 2016-11-16 DIAGNOSIS — E119 Type 2 diabetes mellitus without complications: Secondary | ICD-10-CM

## 2016-11-16 NOTE — Assessment & Plan Note (Signed)
Metformin 

## 2016-11-16 NOTE — Assessment & Plan Note (Signed)
Wt Readings from Last 3 Encounters:  11/16/16 181 lb (82.1 kg)  05/19/16 174 lb 4 oz (79 kg)  01/16/16 176 lb (79.8 kg)  loose wt
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
## 2016-11-16 NOTE — Assessment & Plan Note (Signed)
labs

## 2016-11-16 NOTE — Assessment & Plan Note (Signed)
Norvasc, Spironolactone

## 2016-11-17 DIAGNOSIS — S62662A Nondisplaced fracture of distal phalanx of right middle finger, initial encounter for closed fracture: Secondary | ICD-10-CM | POA: Insufficient documentation

## 2016-12-17 ENCOUNTER — Ambulatory Visit (INDEPENDENT_AMBULATORY_CARE_PROVIDER_SITE_OTHER): Payer: BLUE CROSS/BLUE SHIELD | Admitting: Internal Medicine

## 2016-12-17 ENCOUNTER — Other Ambulatory Visit (INDEPENDENT_AMBULATORY_CARE_PROVIDER_SITE_OTHER): Payer: BLUE CROSS/BLUE SHIELD

## 2016-12-17 ENCOUNTER — Encounter: Payer: Self-pay | Admitting: Internal Medicine

## 2016-12-17 DIAGNOSIS — M545 Low back pain, unspecified: Secondary | ICD-10-CM

## 2016-12-17 DIAGNOSIS — I10 Essential (primary) hypertension: Secondary | ICD-10-CM | POA: Diagnosis not present

## 2016-12-17 DIAGNOSIS — E119 Type 2 diabetes mellitus without complications: Secondary | ICD-10-CM | POA: Diagnosis not present

## 2016-12-17 DIAGNOSIS — E876 Hypokalemia: Secondary | ICD-10-CM

## 2016-12-17 LAB — URINALYSIS
Bilirubin Urine: NEGATIVE
Hgb urine dipstick: NEGATIVE
Leukocytes, UA: NEGATIVE
Nitrite: NEGATIVE
PH: 6 (ref 5.0–8.0)
SPECIFIC GRAVITY, URINE: 1.025 (ref 1.000–1.030)
TOTAL PROTEIN, URINE-UPE24: NEGATIVE
URINE GLUCOSE: NEGATIVE
UROBILINOGEN UA: 0.2 (ref 0.0–1.0)

## 2016-12-17 LAB — BASIC METABOLIC PANEL
BUN: 14 mg/dL (ref 6–23)
CALCIUM: 9.8 mg/dL (ref 8.4–10.5)
CO2: 28 meq/L (ref 19–32)
CREATININE: 0.89 mg/dL (ref 0.40–1.20)
Chloride: 102 mEq/L (ref 96–112)
GFR: 84.55 mL/min (ref >60.00)
GLUCOSE: 103 mg/dL — AB (ref 70–99)
Potassium: 3.4 mEq/L — ABNORMAL LOW (ref 3.5–5.1)
Sodium: 139 mEq/L (ref 135–145)

## 2016-12-17 LAB — HEMOGLOBIN A1C: HEMOGLOBIN A1C: 6.4 % (ref 4.6–6.5)

## 2016-12-17 MED ORDER — TRIAMTERENE-HCTZ 37.5-25 MG PO TABS: 1.0000 | ORAL_TABLET | Freq: Every day | ORAL | 11 refills | Status: DC

## 2016-12-17 MED ORDER — POTASSIUM CHLORIDE ER 10 MEQ PO TBCR: 10.0000 meq | EXTENDED_RELEASE_TABLET | Freq: Every day | ORAL | 0 refills | Status: DC

## 2016-12-17 MED ORDER — IBUPROFEN 600 MG PO TABS: ORAL_TABLET | ORAL | 0 refills | Status: DC
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
## 2016-12-17 NOTE — Assessment & Plan Note (Signed)
Better  Cont w/Metformin

## 2016-12-17 NOTE — Assessment & Plan Note (Signed)
D/c HCTZ Start Maxzide KCl x 1 mo

## 2016-12-17 NOTE — Assessment & Plan Note (Addendum)
Norvasc, Spironolactone Change from HCTZ to Maxzide due to low K 9/18

## 2016-12-17 NOTE — Assessment & Plan Note (Signed)
MSK R flank pain UA Ibuprofenn prn pc Rx

## 2017-01-11 ENCOUNTER — Other Ambulatory Visit: Payer: Self-pay | Admitting: Internal Medicine
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
## 2017-02-05 ENCOUNTER — Other Ambulatory Visit: Payer: Self-pay | Admitting: Internal Medicine

## 2017-03-18 ENCOUNTER — Ambulatory Visit: Payer: BLUE CROSS/BLUE SHIELD | Admitting: Internal Medicine

## 2017-04-26 ENCOUNTER — Ambulatory Visit: Payer: BLUE CROSS/BLUE SHIELD | Admitting: Internal Medicine

## 2017-04-30 ENCOUNTER — Other Ambulatory Visit: Payer: Self-pay | Admitting: Internal Medicine

## 2017-05-13 ENCOUNTER — Other Ambulatory Visit: Payer: Self-pay | Admitting: Internal Medicine

## 2017-05-25 ENCOUNTER — Telehealth: Payer: Self-pay | Admitting: Internal Medicine

## 2017-05-25 DIAGNOSIS — E119 Type 2 diabetes mellitus without complications: Secondary | ICD-10-CM

## 2017-05-25 NOTE — Telephone Encounter (Signed)
Done. Thank you, AP

## 2017-05-25 NOTE — Telephone Encounter (Signed)
Patient called [facility] requesting orders for blood work (including A1c).

## 2017-05-25 NOTE — Addendum Note (Signed)
Addended by: Cassandria Anger on: 05/25/2017 10:44 PM   Modules accepted: Orders

## 2017-05-26 NOTE — Telephone Encounter (Signed)
Called pt no answer LMOM  MD has place lab orders prior to appt,,,/lmb

## 2017-06-06 ENCOUNTER — Encounter: Payer: Self-pay | Admitting: Internal Medicine

## 2017-06-17 ENCOUNTER — Other Ambulatory Visit (INDEPENDENT_AMBULATORY_CARE_PROVIDER_SITE_OTHER): Payer: BLUE CROSS/BLUE SHIELD

## 2017-06-17 DIAGNOSIS — E119 Type 2 diabetes mellitus without complications: Secondary | ICD-10-CM | POA: Diagnosis not present

## 2017-06-17 LAB — BASIC METABOLIC PANEL
BUN: 20 mg/dL (ref 6–23)
CO2: 27 mEq/L (ref 19–32)
Calcium: 9.9 mg/dL (ref 8.4–10.5)
Chloride: 102 mEq/L (ref 96–112)
Creatinine, Ser: 1 mg/dL (ref 0.40–1.20)
GFR: 73.77 mL/min (ref >60.00)
Glucose, Bld: 98 mg/dL (ref 70–99)
POTASSIUM: 3.9 meq/L (ref 3.5–5.1)
SODIUM: 139 meq/L (ref 135–145)

## 2017-06-17 LAB — HEMOGLOBIN A1C: HEMOGLOBIN A1C: 6.7 % — AB (ref 4.6–6.5)

## 2017-06-20 ENCOUNTER — Encounter: Payer: Self-pay | Admitting: Internal Medicine

## 2017-06-20 ENCOUNTER — Ambulatory Visit: Payer: BLUE CROSS/BLUE SHIELD | Admitting: Internal Medicine

## 2017-06-20 DIAGNOSIS — I10 Essential (primary) hypertension: Secondary | ICD-10-CM | POA: Diagnosis not present

## 2017-06-20 DIAGNOSIS — B351 Tinea unguium: Secondary | ICD-10-CM | POA: Diagnosis not present

## 2017-06-20 DIAGNOSIS — E119 Type 2 diabetes mellitus without complications: Secondary | ICD-10-CM | POA: Diagnosis not present

## 2017-06-20 MED ORDER — CICLOPIROX 8 % EX SOLN: Freq: Every day | CUTANEOUS | 0 refills | Status: DC

## 2017-06-20 NOTE — Assessment & Plan Note (Signed)
Have not used [place] yet

## 2017-06-20 NOTE — Assessment & Plan Note (Signed)
Norvasc, Spironolactone, Triamt/HCTZ
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
## 2017-06-20 NOTE — Assessment & Plan Note (Signed)
On Metformin 

## 2017-06-20 NOTE — Patient Instructions (Signed)
Hemoglobin A1c Test Some of the sugar (glucose) that circulates in your blood sticks or binds to blood proteins. Hemoglobin (Hb or Hgb) is one type of blood protein that glucose binds to. It also carries oxygen in the red blood cells (RBCs). When glucose binds to Hb, the glucose-coated Hb is called glycated Hb. Once Hb is glycated, it remains that way for the life of the RBC. This is about 120 days. Rather than testing your blood glucose level on one single day, the hemoglobin A1c (HbA1c) test measures the average amount of glycated hemoglobin and, therefore, the average amount of glucose in your blood during the 3-4 months just before the test is done. The HbA1c test is used to monitor long-term control of blood sugar in people who have diabetes mellitus. The HbA1c test can also be used in addition to or in combination with fasting blood glucose level and oral glucose tolerance tests. What do the results mean? It is your responsibility to obtain your test results. Ask the lab or department performing the test when and how you will get your results. Contact your health care provider to discuss any questions you have about your results. Range of Normal Values Ranges for normal values may vary among different labs and hospitals. You should always check with your health care provider after having lab work or other tests done to discuss the meaning of your test results and whether your values are considered within normal limits. The ranges for normal HbA1c test results are as follows:  Adult or child without diabetes: 4-5.9%.  Adult or child with diabetes and good blood glucose control: less than 6.5%.  Several factors can affect HbA1c test results. These may include:  Diseases (hemoglobinopathies) that cause a change in the shape, size, or amount of Hb in your blood.  Longer than normal RBC life span.  Abnormally low levels of certain proteins in your blood.  Eating foods or taking supplements that  are high in vitamin C (ascorbic acid).  Meaning of Results Outside Normal Value Ranges Abnormally high HbA1c values are most commonly an indication of prediabetes mellitus and diabetes mellitus:  An HbA1c result of 5.7-6.4% is considered diagnostic of prediabetes mellitus.  An HbA1c result of 6.5% or higher on two separate occasions is considered diagnostic of diabetes mellitus.  Abnormally low HbA1c values can be caused by several health conditions. These may include:  Pregnancy.  A large amount of blood loss.  Blood transfusions.  Low red blood cell count (anemia). This is caused by premature destruction of red blood cells.  Long-term kidney failure.  Some unusual forms of Hb (Hb variants), such as sickle cell trait.  Discuss your test results with your health care provider. He or she will use the results to make a diagnosis and determine a treatment plan that is right for you. Talk with your health care provider to discuss your results, treatment options, and if necessary, the need for more tests. Talk with your health care provider if you have any questions about your results. This information is not intended to replace advice given to you by your health care provider. Make sure you discuss any questions you have with your health care provider. Document Released: 04/06/2004 Document Revised: 12/10/2015 Document Reviewed: 07/30/2013 Elsevier Interactive Patient Education  2018 [employer]  

## 2017-08-09 ENCOUNTER — Other Ambulatory Visit: Payer: Self-pay | Admitting: Internal Medicine

## 2017-08-11 ENCOUNTER — Other Ambulatory Visit: Payer: Self-pay | Admitting: Internal Medicine

## 2017-10-21 ENCOUNTER — Ambulatory Visit: Payer: BLUE CROSS/BLUE SHIELD | Admitting: Internal Medicine

## 2017-11-08 ENCOUNTER — Ambulatory Visit: Payer: BLUE CROSS/BLUE SHIELD | Admitting: Internal Medicine

## 2017-11-08 ENCOUNTER — Other Ambulatory Visit: Payer: Self-pay | Admitting: Internal Medicine

## 2017-12-07 ENCOUNTER — Encounter: Payer: Self-pay | Admitting: Internal Medicine

## 2017-12-07 ENCOUNTER — Ambulatory Visit: Payer: BLUE CROSS/BLUE SHIELD | Admitting: Internal Medicine

## 2017-12-07 DIAGNOSIS — E6609 Other obesity due to excess calories: Secondary | ICD-10-CM

## 2017-12-07 DIAGNOSIS — I10 Essential (primary) hypertension: Secondary | ICD-10-CM

## 2017-12-07 DIAGNOSIS — E119 Type 2 diabetes mellitus without complications: Secondary | ICD-10-CM | POA: Diagnosis not present

## 2017-12-07 DIAGNOSIS — Z6831 Body mass index (BMI) 31.0-31.9, adult: Secondary | ICD-10-CM | POA: Diagnosis not present

## 2017-12-07 MED ORDER — IBUPROFEN 600 MG PO TABS: ORAL_TABLET | ORAL | 0 refills | Status: DC
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
## 2017-12-07 NOTE — Assessment & Plan Note (Signed)
Labs Metformin po

## 2017-12-07 NOTE — Patient Instructions (Signed)
Reduce Maxzide in 1/2 and check BP

## 2017-12-07 NOTE — Assessment & Plan Note (Signed)
Wt Readings from Last 3 Encounters:  12/07/17 187 lb (84.8 kg)  06/20/17 184 lb (83.5 kg)  12/17/16 180 lb (81.6 kg)   Diet discussed

## 2017-12-07 NOTE — Assessment & Plan Note (Addendum)
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

## 2017-12-08 ENCOUNTER — Other Ambulatory Visit: Payer: Self-pay | Admitting: Internal Medicine

## 2018-01-04 ENCOUNTER — Other Ambulatory Visit: Payer: Self-pay | Admitting: Internal Medicine

## 2018-02-05 ENCOUNTER — Other Ambulatory Visit: Payer: Self-pay | Admitting: Internal Medicine

## 2018-04-18 ENCOUNTER — Other Ambulatory Visit (INDEPENDENT_AMBULATORY_CARE_PROVIDER_SITE_OTHER): Payer: BLUE CROSS/BLUE SHIELD

## 2018-04-18 DIAGNOSIS — E119 Type 2 diabetes mellitus without complications: Secondary | ICD-10-CM

## 2018-04-18 LAB — BASIC METABOLIC PANEL
BUN: 20 mg/dL (ref 6–23)
CHLORIDE: 102 meq/L (ref 96–112)
CO2: 26 mEq/L (ref 19–32)
Calcium: 10.3 mg/dL (ref 8.4–10.5)
Creatinine, Ser: 0.94 mg/dL (ref 0.40–1.20)
GFR: 74.32 mL/min (ref >60.00)
GLUCOSE: 99 mg/dL (ref 70–99)
POTASSIUM: 4.2 meq/L (ref 3.5–5.1)
SODIUM: 138 meq/L (ref 135–145)

## 2018-04-18 LAB — HEMOGLOBIN A1C: Hgb A1c MFr Bld: 6.8 % — ABNORMAL HIGH (ref 4.6–6.5)

## 2018-04-19 ENCOUNTER — Encounter: Payer: Self-pay | Admitting: Internal Medicine

## 2018-04-19 ENCOUNTER — Ambulatory Visit (INDEPENDENT_AMBULATORY_CARE_PROVIDER_SITE_OTHER): Payer: BLUE CROSS/BLUE SHIELD | Admitting: Internal Medicine

## 2018-04-19 DIAGNOSIS — E119 Type 2 diabetes mellitus without complications: Secondary | ICD-10-CM | POA: Diagnosis not present

## 2018-04-19 DIAGNOSIS — I10 Essential (primary) hypertension: Secondary | ICD-10-CM

## 2018-04-19 DIAGNOSIS — E785 Hyperlipidemia, unspecified: Secondary | ICD-10-CM

## 2018-04-19 NOTE — Assessment & Plan Note (Signed)
Doing well 
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
## 2018-04-25 MED ORDER — METFORMIN HCL 500 MG PO TABS: 500.0000 mg | ORAL_TABLET | Freq: Two times a day (BID) | ORAL | 11 refills | Status: DC

## 2018-04-25 NOTE — Assessment & Plan Note (Signed)
BP Readings from Last 3 Encounters:  04/19/18 (!) 144/86  12/07/17 136/86  06/20/17 138/72

## 2018-04-25 NOTE — Assessment & Plan Note (Addendum)
  On diet  

## 2018-05-04 ENCOUNTER — Encounter: Payer: Self-pay | Admitting: Gastroenterology

## 2018-08-20 ENCOUNTER — Other Ambulatory Visit: Payer: Self-pay | Admitting: Internal Medicine

## 2018-09-19 ENCOUNTER — Encounter: Payer: BLUE CROSS/BLUE SHIELD | Admitting: Internal Medicine

## 2018-12-15 ENCOUNTER — Other Ambulatory Visit: Payer: Self-pay

## 2018-12-15 DIAGNOSIS — Z20822 Contact with and (suspected) exposure to covid-19: Secondary | ICD-10-CM

## 2018-12-16 LAB — NOVEL CORONAVIRUS, NAA: SARS-CoV-2, NAA: NOT DETECTED

## 2019-03-05 ENCOUNTER — Other Ambulatory Visit: Payer: Self-pay | Admitting: Internal Medicine

## 2019-03-27 ENCOUNTER — Other Ambulatory Visit: Payer: Self-pay | Admitting: Internal Medicine

## 2019-04-23 ENCOUNTER — Telehealth: Payer: Self-pay

## 2019-04-23 ENCOUNTER — Other Ambulatory Visit: Payer: Self-pay

## 2019-04-23 ENCOUNTER — Other Ambulatory Visit: Payer: Self-pay | Admitting: Internal Medicine

## 2019-04-23 DIAGNOSIS — E119 Type 2 diabetes mellitus without complications: Secondary | ICD-10-CM

## 2019-04-23 DIAGNOSIS — Z0000 Encounter for general adult medical examination without abnormal findings: Secondary | ICD-10-CM

## 2019-04-23 MED ORDER — SPIRONOLACTONE 50 MG PO TABS: 50.0000 mg | ORAL_TABLET | Freq: Once | ORAL | 0 refills | Status: DC

## 2019-04-23 MED ORDER — AMLODIPINE BESYLATE 10 MG PO TABS: 10.0000 mg | ORAL_TABLET | Freq: Every day | ORAL | 0 refills | Status: DC

## 2019-04-23 NOTE — Telephone Encounter (Signed)
New message     1. Which medications need to be refilled? (please list name of each medication and dose if known)   amLODipine (NORVASC) 10 MG tablet  spironolactone (ALDACTONE) 50 MG tablet(Expired)  2. Which pharmacy/location (including street and city if local pharmacy) is medication to be sent to? [facility] on [address]   3. Do they need a 30 day or 90 day supply? 90 days supply

## 2019-04-23 NOTE — Telephone Encounter (Signed)
Pt past due for OV, only a 30 day was sent in to get pt to appointment

## 2019-04-23 NOTE — Telephone Encounter (Signed)
New message   The patient is asking for lab work before the appointment on  05/16/19

## 2019-04-23 NOTE — Telephone Encounter (Signed)
Unable to reach pt  Labs ordered, if pt calls back please schedule lab appt

## 2019-05-16 ENCOUNTER — Encounter: Payer: BC Managed Care – PPO | Admitting: Internal Medicine

## 2019-05-16 ENCOUNTER — Other Ambulatory Visit (INDEPENDENT_AMBULATORY_CARE_PROVIDER_SITE_OTHER): Payer: BC Managed Care – PPO

## 2019-05-16 DIAGNOSIS — E119 Type 2 diabetes mellitus without complications: Secondary | ICD-10-CM | POA: Diagnosis not present

## 2019-05-16 DIAGNOSIS — Z0000 Encounter for general adult medical examination without abnormal findings: Secondary | ICD-10-CM

## 2019-05-16 LAB — LIPID PANEL
Cholesterol: 219 mg/dL — ABNORMAL HIGH (ref 0–200)
HDL: 52 mg/dL (ref >39.00)
LDL Cholesterol: 151 mg/dL — ABNORMAL HIGH (ref 0–99)
NonHDL: 167.02
Total CHOL/HDL Ratio: 4
Triglycerides: 82 mg/dL (ref 0.0–149.0)
VLDL: 16.4 mg/dL (ref 0.0–40.0)

## 2019-05-16 LAB — URINALYSIS
Bilirubin Urine: NEGATIVE
Hgb urine dipstick: NEGATIVE
Ketones, ur: NEGATIVE
Leukocytes,Ua: NEGATIVE
Nitrite: NEGATIVE
Specific Gravity, Urine: 1.02 (ref 1.000–1.030)
Total Protein, Urine: NEGATIVE
Urine Glucose: NEGATIVE
Urobilinogen, UA: 0.2 (ref 0.0–1.0)
pH: 6.5 (ref 5.0–8.0)

## 2019-05-16 LAB — BASIC METABOLIC PANEL
BUN: 14 mg/dL (ref 6–23)
CO2: 28 mEq/L (ref 19–32)
Calcium: 9.9 mg/dL (ref 8.4–10.5)
Chloride: 104 mEq/L (ref 96–112)
Creatinine, Ser: 0.71 mg/dL (ref 0.40–1.20)
GFR: 102.36 mL/min (ref >60.00)
Glucose, Bld: 101 mg/dL — ABNORMAL HIGH (ref 70–99)
Potassium: 4 mEq/L (ref 3.5–5.1)
Sodium: 139 mEq/L (ref 135–145)

## 2019-05-16 LAB — HEPATIC FUNCTION PANEL
ALT: 14 U/L (ref 0–35)
AST: 18 U/L (ref 0–37)
Albumin: 4.4 g/dL (ref 3.5–5.2)
Alkaline Phosphatase: 63 U/L (ref 39–117)
Bilirubin, Direct: 0.1 mg/dL (ref 0.0–0.3)
Total Bilirubin: 0.4 mg/dL (ref 0.2–1.2)
Total Protein: 7.7 g/dL (ref 6.0–8.3)

## 2019-05-16 LAB — TSH: TSH: 0.79 u[IU]/mL (ref 0.35–4.50)

## 2019-05-16 LAB — CBC WITH DIFFERENTIAL/PLATELET
Basophils Absolute: 0 10*3/uL (ref 0.0–0.1)
Basophils Relative: 0.6 % (ref 0.0–3.0)
Eosinophils Absolute: 0.4 10*3/uL (ref 0.0–0.7)
Eosinophils Relative: 7.8 % — ABNORMAL HIGH (ref 0.0–5.0)
HCT: 37.4 % (ref 36.0–46.0)
Hemoglobin: 12.4 g/dL (ref 12.0–15.0)
Lymphocytes Relative: 30.8 % (ref 12.0–46.0)
Lymphs Abs: 1.4 10*3/uL (ref 0.7–4.0)
MCHC: 33.2 g/dL (ref 30.0–36.0)
MCV: 89.8 fl (ref 78.0–100.0)
Monocytes Absolute: 0.4 10*3/uL (ref 0.1–1.0)
Monocytes Relative: 8.4 % (ref 3.0–12.0)
Neutro Abs: 2.4 10*3/uL (ref 1.4–7.7)
Neutrophils Relative %: 52.4 % (ref 43.0–77.0)
Platelets: 273 10*3/uL (ref 150.0–400.0)
RBC: 4.17 Mil/uL (ref 3.87–5.11)
RDW: 13.9 % (ref 11.5–15.5)
WBC: 4.5 10*3/uL (ref 4.0–10.5)

## 2019-05-16 LAB — HEMOGLOBIN A1C: Hgb A1c MFr Bld: 7 % — ABNORMAL HIGH (ref 4.6–6.5)

## 2019-05-21 ENCOUNTER — Encounter: Payer: Self-pay | Admitting: Internal Medicine

## 2019-05-21 ENCOUNTER — Other Ambulatory Visit: Payer: Self-pay

## 2019-05-21 ENCOUNTER — Ambulatory Visit (INDEPENDENT_AMBULATORY_CARE_PROVIDER_SITE_OTHER): Payer: BC Managed Care – PPO | Admitting: Internal Medicine

## 2019-05-21 VITALS — BP 136/88 | HR 94 | Temp 98.2°F | Ht 63.0 in | Wt 182.0 lb

## 2019-05-21 DIAGNOSIS — Z0000 Encounter for general adult medical examination without abnormal findings: Secondary | ICD-10-CM | POA: Diagnosis not present

## 2019-05-21 DIAGNOSIS — M79645 Pain in left finger(s): Secondary | ICD-10-CM

## 2019-05-21 DIAGNOSIS — I10 Essential (primary) hypertension: Secondary | ICD-10-CM | POA: Diagnosis not present

## 2019-05-21 DIAGNOSIS — M25511 Pain in right shoulder: Secondary | ICD-10-CM | POA: Diagnosis not present

## 2019-05-21 DIAGNOSIS — M25519 Pain in unspecified shoulder: Secondary | ICD-10-CM | POA: Insufficient documentation

## 2019-05-21 DIAGNOSIS — G8929 Other chronic pain: Secondary | ICD-10-CM

## 2019-05-21 DIAGNOSIS — E119 Type 2 diabetes mellitus without complications: Secondary | ICD-10-CM | POA: Diagnosis not present

## 2019-05-21 MED ORDER — SPIRONOLACTONE 50 MG PO TABS: 50.0000 mg | ORAL_TABLET | Freq: Once | ORAL | 3 refills | Status: DC

## 2019-05-21 MED ORDER — METFORMIN HCL 500 MG PO TABS: 500.0000 mg | ORAL_TABLET | Freq: Two times a day (BID) | ORAL | 3 refills | Status: DC

## 2019-05-21 MED ORDER — AMLODIPINE BESYLATE 10 MG PO TABS: 10.0000 mg | ORAL_TABLET | Freq: Every day | ORAL | 3 refills | Status: DC

## 2019-05-21 MED ORDER — MELOXICAM 15 MG PO TABS: 15.0000 mg | ORAL_TABLET | Freq: Every day | ORAL | 0 refills | Status: DC
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
## 2019-05-21 NOTE — Assessment & Plan Note (Signed)
BP Readings from Last 3 Encounters:  05/21/19 136/88  04/19/18 (!) 144/86  12/07/17 136/86

## 2019-05-21 NOTE — Patient Instructions (Signed)

## 2019-05-21 NOTE — Assessment & Plan Note (Signed)
Worse R - ?rotator cuff tear Sports Med ref Meloxicam x 1 mo

## 2019-05-21 NOTE — Assessment & Plan Note (Signed)
A cardiac CT scan for calcium scoring test was offered

## 2019-05-21 NOTE — Assessment & Plan Note (Signed)
Sports Med ref Meloxicam x 1 mo

## 2019-05-21 NOTE — Assessment & Plan Note (Signed)
We discussed age appropriate health related issues, including available/recomended screening tests and vaccinations. We discussed a need for adhering to healthy diet and exercise. Labs were ordered to be later reviewed . All questions were answered. A cardiac CT scan for calcium scoring test was offered 2/21 Colon 2015

## 2019-06-06 ENCOUNTER — Telehealth: Payer: Self-pay

## 2019-06-06 NOTE — Telephone Encounter (Signed)
3 months supply with 3 refills.   1.Medication Requested:amLODipine (NORVASC) 10 MG tablet  2. Pharmacy ([place]):[id number] [place], [place] - [address]  3. On Med List: Yes   4. Last Visit with PCP: 2.22.21   5. Next visit date with PCP: 8.26.21    Agent: Please be advised that RX refills may take up to 3 business days. We ask that you follow-up with your pharmacy.

## 2019-06-07 NOTE — Telephone Encounter (Signed)
LMTCB, RX was sent 05/21/19

## 2019-06-14 ENCOUNTER — Ambulatory Visit: Payer: Self-pay | Attending: Internal Medicine

## 2019-06-14 DIAGNOSIS — Z23 Encounter for immunization: Secondary | ICD-10-CM

## 2019-06-14 NOTE — Progress Notes (Signed)
   Covid-19 Vaccination Clinic  Name:  Allison Harrison    MRN: [id number] DOB: [birth year]  06/14/2019  [name] was observed post Covid-19 immunization for 15 minutes without incident. She was provided with Vaccine Information Sheet and instruction to access the V-Safe system.   [name] was instructed to call 911 with any severe reactions post vaccine: Marland Kitchen Difficulty breathing  . Swelling of face and throat  . A fast heartbeat  . A bad rash all over body  . Dizziness and weakness   Immunizations Administered    Name Date Dose VIS Date Route   Pfizer COVID-19 Vaccine 06/14/2019  3:36 PM 0.3 mL 03/09/2019 Intramuscular   Manufacturer: [place]   Lot: [id number]   [place]: [id number]

## 2019-06-19 ENCOUNTER — Other Ambulatory Visit: Payer: Self-pay | Admitting: Internal Medicine

## 2019-07-02 ENCOUNTER — Ambulatory Visit: Payer: BC Managed Care – PPO

## 2019-07-09 ENCOUNTER — Ambulatory Visit: Payer: Self-pay | Attending: Internal Medicine

## 2019-07-09 DIAGNOSIS — Z23 Encounter for immunization: Secondary | ICD-10-CM

## 2019-07-09 NOTE — Progress Notes (Signed)
   Covid-19 Vaccination Clinic  Name:  Allison Harrison    MRN: [id number] DOB: [birth year]  07/09/2019  [name] was observed post Covid-19 immunization for 15 minutes without incident. She was provided with Vaccine Information Sheet and instruction to access the V-Safe system.   [name] was instructed to call 911 with any severe reactions post vaccine: Marland Kitchen Difficulty breathing  . Swelling of face and throat  . A fast heartbeat  . A bad rash all over body  . Dizziness and weakness   Immunizations Administered    Name Date Dose VIS Date Route   Pfizer COVID-19 Vaccine 07/09/2019  4:46 PM 0.3 mL 03/09/2019 Intramuscular   Manufacturer: [place]   Lot: [id number]   [place]: [id number]

## 2019-11-22 ENCOUNTER — Ambulatory Visit: Payer: BC Managed Care – PPO | Admitting: Internal Medicine

## 2019-12-20 DIAGNOSIS — Z124 Encounter for screening for malignant neoplasm of cervix: Secondary | ICD-10-CM | POA: Diagnosis not present

## 2019-12-20 DIAGNOSIS — Z01419 Encounter for gynecological examination (general) (routine) without abnormal findings: Secondary | ICD-10-CM | POA: Diagnosis not present

## 2019-12-20 DIAGNOSIS — Z1231 Encounter for screening mammogram for malignant neoplasm of breast: Secondary | ICD-10-CM | POA: Diagnosis not present

## 2019-12-20 DIAGNOSIS — Z6833 Body mass index (BMI) 33.0-33.9, adult: Secondary | ICD-10-CM | POA: Diagnosis not present

## 2019-12-21 ENCOUNTER — Other Ambulatory Visit: Payer: Self-pay | Admitting: Obstetrics

## 2019-12-21 DIAGNOSIS — Z1231 Encounter for screening mammogram for malignant neoplasm of breast: Secondary | ICD-10-CM

## 2019-12-25 ENCOUNTER — Ambulatory Visit (INDEPENDENT_AMBULATORY_CARE_PROVIDER_SITE_OTHER): Payer: BC Managed Care – PPO | Admitting: Internal Medicine

## 2019-12-25 ENCOUNTER — Encounter: Payer: Self-pay | Admitting: Internal Medicine

## 2019-12-25 ENCOUNTER — Other Ambulatory Visit: Payer: Self-pay

## 2019-12-25 VITALS — BP 150/90 | HR 74 | Temp 98.4°F | Ht 63.0 in | Wt 185.0 lb

## 2019-12-25 DIAGNOSIS — Z6831 Body mass index (BMI) 31.0-31.9, adult: Secondary | ICD-10-CM

## 2019-12-25 DIAGNOSIS — I10 Essential (primary) hypertension: Secondary | ICD-10-CM

## 2019-12-25 DIAGNOSIS — Z0000 Encounter for general adult medical examination without abnormal findings: Secondary | ICD-10-CM

## 2019-12-25 DIAGNOSIS — E119 Type 2 diabetes mellitus without complications: Secondary | ICD-10-CM | POA: Diagnosis not present

## 2019-12-25 NOTE — Assessment & Plan Note (Addendum)
Norvasc, Spironolactone BP is good at home

## 2019-12-25 NOTE — Assessment & Plan Note (Signed)
Wt Readings from Last 3 Encounters:  12/25/19 185 lb (83.9 kg)  05/21/19 182 lb (82.6 kg)  04/19/18 195 lb (88.5 kg)
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
## 2019-12-25 NOTE — Assessment & Plan Note (Signed)
Metformin Labs 

## 2019-12-25 NOTE — Assessment & Plan Note (Addendum)
  We discussed age appropriate health related issues, including available/recomended screening tests and vaccinations. Labs were ordered to be later reviewed . All questions were answered. We discussed one or more of the following - seat belt use, use of sunscreen/sun exposure exercise, safe sex, fall risk reduction, second hand smoke exposure, firearm use and storage, seat belt use, a need for adhering to healthy diet and exercise. Labs were ordered.  All questions were answered. Colon 2015

## 2020-03-26 ENCOUNTER — Ambulatory Visit: Payer: BC Managed Care – PPO | Admitting: Internal Medicine

## 2020-04-28 ENCOUNTER — Ambulatory Visit: Payer: BC Managed Care – PPO | Admitting: Internal Medicine

## 2020-05-23 ENCOUNTER — Other Ambulatory Visit: Payer: Self-pay | Admitting: Internal Medicine

## 2020-05-23 ENCOUNTER — Telehealth: Payer: Self-pay | Admitting: Internal Medicine

## 2020-05-23 MED ORDER — SPIRONOLACTONE 50 MG PO TABS: 50.0000 mg | ORAL_TABLET | Freq: Once | ORAL | 0 refills | Status: DC

## 2020-05-23 NOTE — Telephone Encounter (Signed)
Per office policy sent 30 day to local pharmacy until appt.../lmb  

## 2020-05-23 NOTE — Telephone Encounter (Signed)
spironolactone (ALDACTONE) 50 MG tablet(Expired) Patient requesting a refill, has an appointment on 03.10.22 and she is requesting a short supply until then

## 2020-06-03 ENCOUNTER — Other Ambulatory Visit: Payer: Self-pay | Admitting: Internal Medicine

## 2020-06-03 ENCOUNTER — Other Ambulatory Visit (INDEPENDENT_AMBULATORY_CARE_PROVIDER_SITE_OTHER): Payer: BC Managed Care – PPO

## 2020-06-03 DIAGNOSIS — E119 Type 2 diabetes mellitus without complications: Secondary | ICD-10-CM | POA: Diagnosis not present

## 2020-06-03 DIAGNOSIS — Z0000 Encounter for general adult medical examination without abnormal findings: Secondary | ICD-10-CM

## 2020-06-03 LAB — URINALYSIS
Bilirubin Urine: NEGATIVE
Hgb urine dipstick: NEGATIVE
Ketones, ur: NEGATIVE
Leukocytes,Ua: NEGATIVE
Nitrite: NEGATIVE
Specific Gravity, Urine: 1.02 (ref 1.000–1.030)
Total Protein, Urine: NEGATIVE
Urine Glucose: NEGATIVE
Urobilinogen, UA: 0.2 (ref 0.0–1.0)
pH: 6.5 (ref 5.0–8.0)

## 2020-06-03 LAB — COMPREHENSIVE METABOLIC PANEL
ALT: 19 U/L (ref 0–35)
AST: 22 U/L (ref 0–37)
Albumin: 4.4 g/dL (ref 3.5–5.2)
Alkaline Phosphatase: 72 U/L (ref 39–117)
BUN: 11 mg/dL (ref 6–23)
CO2: 27 mEq/L (ref 19–32)
Calcium: 9.5 mg/dL (ref 8.4–10.5)
Chloride: 103 mEq/L (ref 96–112)
Creatinine, Ser: 0.78 mg/dL (ref 0.40–1.20)
GFR: 83.43 mL/min (ref >60.00)
Glucose, Bld: 98 mg/dL (ref 70–99)
Potassium: 4 mEq/L (ref 3.5–5.1)
Sodium: 140 mEq/L (ref 135–145)
Total Bilirubin: 0.4 mg/dL (ref 0.2–1.2)
Total Protein: 7.7 g/dL (ref 6.0–8.3)

## 2020-06-03 LAB — CBC WITH DIFFERENTIAL/PLATELET
Basophils Absolute: 0 10*3/uL (ref 0.0–0.1)
Basophils Relative: 0.7 % (ref 0.0–3.0)
Eosinophils Absolute: 0.3 10*3/uL (ref 0.0–0.7)
Eosinophils Relative: 6.3 % — ABNORMAL HIGH (ref 0.0–5.0)
HCT: 38.5 % (ref 36.0–46.0)
Hemoglobin: 12.7 g/dL (ref 12.0–15.0)
Lymphocytes Relative: 34.4 % (ref 12.0–46.0)
Lymphs Abs: 1.6 10*3/uL (ref 0.7–4.0)
MCHC: 33 g/dL (ref 30.0–36.0)
MCV: 88.3 fl (ref 78.0–100.0)
Monocytes Absolute: 0.4 10*3/uL (ref 0.1–1.0)
Monocytes Relative: 7.6 % (ref 3.0–12.0)
Neutro Abs: 2.4 10*3/uL (ref 1.4–7.7)
Neutrophils Relative %: 51 % (ref 43.0–77.0)
Platelets: 266 10*3/uL (ref 150.0–400.0)
RBC: 4.36 Mil/uL (ref 3.87–5.11)
RDW: 13.6 % (ref 11.5–15.5)
WBC: 4.7 10*3/uL (ref 4.0–10.5)

## 2020-06-03 LAB — TSH: TSH: 0.83 u[IU]/mL (ref 0.35–4.50)

## 2020-06-03 LAB — LIPID PANEL
Cholesterol: 221 mg/dL — ABNORMAL HIGH (ref 0–200)
HDL: 56.6 mg/dL (ref >39.00)
LDL Cholesterol: 150 mg/dL — ABNORMAL HIGH (ref 0–99)
NonHDL: 164.32
Total CHOL/HDL Ratio: 4
Triglycerides: 72 mg/dL (ref 0.0–149.0)
VLDL: 14.4 mg/dL (ref 0.0–40.0)

## 2020-06-03 LAB — MICROALBUMIN / CREATININE URINE RATIO
Creatinine,U: 55.9 mg/dL
Microalb Creat Ratio: 1.3 mg/g (ref 0.0–30.0)
Microalb, Ur: <0.7 mg/dL (ref 0.0–1.9)

## 2020-06-03 LAB — HEMOGLOBIN A1C: Hgb A1c MFr Bld: 6.6 % — ABNORMAL HIGH (ref 4.6–6.5)

## 2020-06-03 NOTE — Addendum Note (Signed)
Addended by: Trenda Moots on: 04/30/5832 07:24 AM   Modules accepted: Orders

## 2020-06-05 ENCOUNTER — Encounter: Payer: Self-pay | Admitting: Internal Medicine

## 2020-06-05 ENCOUNTER — Other Ambulatory Visit: Payer: Self-pay

## 2020-06-05 ENCOUNTER — Ambulatory Visit: Payer: BC Managed Care – PPO | Admitting: Internal Medicine

## 2020-06-05 DIAGNOSIS — E785 Hyperlipidemia, unspecified: Secondary | ICD-10-CM | POA: Diagnosis not present

## 2020-06-05 DIAGNOSIS — E119 Type 2 diabetes mellitus without complications: Secondary | ICD-10-CM | POA: Diagnosis not present

## 2020-06-05 DIAGNOSIS — I10 Essential (primary) hypertension: Secondary | ICD-10-CM

## 2020-06-05 MED ORDER — SPIRONOLACTONE 50 MG PO TABS: 50.0000 mg | ORAL_TABLET | Freq: Every day | ORAL | 1 refills | Status: DC

## 2020-06-05 MED ORDER — SPIRONOLACTONE 50 MG PO TABS: 50.0000 mg | ORAL_TABLET | Freq: Every day | ORAL | 3 refills | Status: DC

## 2020-06-05 MED ORDER — AMLODIPINE BESYLATE 10 MG PO TABS: 10.0000 mg | ORAL_TABLET | Freq: Every day | ORAL | 3 refills | Status: DC

## 2020-06-05 MED ORDER — METFORMIN HCL 500 MG PO TABS: 500.0000 mg | ORAL_TABLET | Freq: Two times a day (BID) | ORAL | 3 refills | Status: DC

## 2020-06-05 MED ORDER — METFORMIN HCL 500 MG PO TABS: 500.0000 mg | ORAL_TABLET | Freq: Two times a day (BID) | ORAL | 1 refills | Status: DC

## 2020-06-05 NOTE — Patient Instructions (Signed)
Cardiac CT calcium scoring test $99 Tel # is [phone]   Computed tomography, more commonly known as a CT or CAT scan, is a diagnostic medical imaging test. Like traditional x-rays, it produces multiple images or pictures of the inside of the body. The cross-sectional images generated during a CT scan can be reformatted in multiple planes. They can even generate three-dimensional images. These images can be viewed on a computer monitor, printed on film or by a 3D printer, or transferred to a CD or DVD. CT images of internal organs, bones, soft tissue and blood vessels provide greater detail than traditional x-rays, particularly of soft tissues and blood vessels. A cardiac CT scan for coronary calcium is a non-invasive way of obtaining information about the presence, location and extent of calcified plaque in the coronary arteries--the vessels that supply oxygen-containing blood to the heart muscle. Calcified plaque results when there is a build-up of fat and other substances under the inner layer of the artery. This material can calcify which signals the presence of atherosclerosis, a disease of the vessel wall, also called coronary artery disease (CAD). People with this disease have an increased risk for heart attacks. In addition, over time, progression of plaque build up (CAD) can narrow the arteries or even close off blood flow to the heart. The result may be chest pain, sometimes called "angina," or a heart attack. Because calcium is a marker of CAD, the amount of calcium detected on a cardiac CT scan is a helpful prognostic tool. The findings on cardiac CT are expressed as a calcium score. Another name for this test is coronary artery calcium scoring.  What are some common uses of the procedure? The goal of cardiac CT scan for calcium scoring is to determine if CAD is present and to what extent, even if there are no symptoms. It is a screening study that may be recommended by a physician for  patients with risk factors for CAD but no clinical symptoms. The major risk factors for CAD are: . high blood cholesterol levels  . family history of heart attacks  . diabetes  . high blood pressure  . cigarette smoking  . overweight or obese  . physical inactivity   A negative cardiac CT scan for calcium scoring shows no calcification within the coronary arteries. This suggests that CAD is absent or so minimal it cannot be seen by this technique. The chance of having a heart attack over the next two to five years is very low under these circumstances. A positive test means that CAD is present, regardless of whether or not the patient is experiencing any symptoms. The amount of calcification--expressed as the calcium score--may help to predict the likelihood of a myocardial infarction (heart attack) in the coming years and helps your medical doctor or cardiologist decide whether the patient may need to take preventive medicine or undertake other measures such as diet and exercise to lower the risk for heart attack. The extent of CAD is graded according to your calcium score:  Calcium Score  Presence of CAD (coronary artery disease)  0 No evidence of CAD   1-10 Minimal evidence of CAD  11-100 Mild evidence of CAD  101-400 Moderate evidence of CAD  Over 400 Extensive evidence of CAD    

## 2020-06-05 NOTE — Assessment & Plan Note (Addendum)
A cardiac CT scan for calcium scoring test was offered  Hemoglobin A1c is better.  Continue with Metformin.

## 2020-06-05 NOTE — Assessment & Plan Note (Addendum)
Cardiac coronary calcium CT score test was advised.  Info provided. On diet.  The patient is reluctant to take a lipid-lowering agent
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
## 2020-06-05 NOTE — Assessment & Plan Note (Signed)
Suboptimal control.  Continue with low-salt diet and weight loss.  The patient has been exercising.  Continue with amlodipine and spironolactone

## 2020-09-10 ENCOUNTER — Ambulatory Visit: Payer: BC Managed Care – PPO | Admitting: Internal Medicine

## 2020-10-08 DIAGNOSIS — Z0120 Encounter for dental examination and cleaning without abnormal findings: Secondary | ICD-10-CM | POA: Diagnosis not present

## 2020-10-22 ENCOUNTER — Other Ambulatory Visit (INDEPENDENT_AMBULATORY_CARE_PROVIDER_SITE_OTHER): Payer: BC Managed Care – PPO

## 2020-10-22 DIAGNOSIS — E785 Hyperlipidemia, unspecified: Secondary | ICD-10-CM

## 2020-10-22 DIAGNOSIS — E119 Type 2 diabetes mellitus without complications: Secondary | ICD-10-CM | POA: Diagnosis not present

## 2020-10-22 LAB — COMPREHENSIVE METABOLIC PANEL
ALT: 14 U/L (ref 0–35)
AST: 19 U/L (ref 0–37)
Albumin: 4.3 g/dL (ref 3.5–5.2)
Alkaline Phosphatase: 70 U/L (ref 39–117)
BUN: 16 mg/dL (ref 6–23)
CO2: 25 mEq/L (ref 19–32)
Calcium: 9.5 mg/dL (ref 8.4–10.5)
Chloride: 105 mEq/L (ref 96–112)
Creatinine, Ser: 0.8 mg/dL (ref 0.40–1.20)
GFR: 80.72 mL/min (ref >60.00)
Glucose, Bld: 96 mg/dL (ref 70–99)
Potassium: 3.9 mEq/L (ref 3.5–5.1)
Sodium: 140 mEq/L (ref 135–145)
Total Bilirubin: 0.4 mg/dL (ref 0.2–1.2)
Total Protein: 7.9 g/dL (ref 6.0–8.3)

## 2020-10-22 LAB — HEMOGLOBIN A1C: Hgb A1c MFr Bld: 6.6 % — ABNORMAL HIGH (ref 4.6–6.5)

## 2020-10-23 ENCOUNTER — Ambulatory Visit: Payer: BC Managed Care – PPO | Admitting: Internal Medicine

## 2020-10-23 ENCOUNTER — Encounter: Payer: Self-pay | Admitting: Internal Medicine

## 2020-10-23 ENCOUNTER — Other Ambulatory Visit: Payer: Self-pay

## 2020-10-23 DIAGNOSIS — E119 Type 2 diabetes mellitus without complications: Secondary | ICD-10-CM

## 2020-10-23 DIAGNOSIS — Z6831 Body mass index (BMI) 31.0-31.9, adult: Secondary | ICD-10-CM

## 2020-10-23 DIAGNOSIS — E6609 Other obesity due to excess calories: Secondary | ICD-10-CM

## 2020-10-23 DIAGNOSIS — I10 Essential (primary) hypertension: Secondary | ICD-10-CM

## 2020-10-23 MED ORDER — TIRZEPATIDE 2.5 MG/0.5ML ~~LOC~~ SOAJ: 2.5000 mg | SUBCUTANEOUS | 3 refills | Status: DC
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
## 2020-10-23 NOTE — Assessment & Plan Note (Addendum)
Wt Readings from Last 3 Encounters:  10/23/20 188 lb 3.2 oz (85.4 kg)  06/05/20 187 lb 12.8 oz (85.2 kg)  12/25/19 185 lb (83.9 kg)   Not better.  We discussed the option of using Mounjaro for diabetes and weight loss.  Information provided.  I think weight loss would be beneficial.  I printed the prescription for the patient to take to the pharmacy after she does some reading on [facility]

## 2020-10-26 NOTE — Assessment & Plan Note (Signed)
Not well controlled according to the blood pressure record.  The patient states her blood pressure is normal at home.  Monitor blood pressure at home.  We discussed the need for weight loss.  Continue with amlodipine and spironolactone

## 2020-10-26 NOTE — Assessment & Plan Note (Signed)
We discussed the option of using Mounjaro.  Information provided.  I think weight loss would be beneficial.  I printed the prescription for the patient to take to the pharmacy after she does some reading on [facility]

## 2020-11-17 DIAGNOSIS — E119 Type 2 diabetes mellitus without complications: Secondary | ICD-10-CM | POA: Diagnosis not present

## 2021-02-11 ENCOUNTER — Ambulatory Visit: Payer: BC Managed Care – PPO | Admitting: Internal Medicine

## 2021-06-07 ENCOUNTER — Other Ambulatory Visit: Payer: Self-pay | Admitting: Internal Medicine

## 2021-06-08 ENCOUNTER — Telehealth: Payer: Self-pay | Admitting: Internal Medicine

## 2021-06-09 NOTE — Telephone Encounter (Signed)
1.Medication Requested: spironolactone (ALDACTONE) 50 MG tablet ?amLODipine (NORVASC) 10 MG tablet ?metFORMIN (GLUCOPHAGE) 500 MG tablet ? ? ?2. Pharmacy (Name, Street, [place]): [id number] [place], [place] [address] ?Phone:  [phone]?Fax:  [phone]? ?  ?3. On Med List: yes ? ?4. Last Visit with PCP: 09/2020 ? ?5. Next visit date with PCP: 04.17.2023 ? ? ?Agent: Please be advised that RX refills may take up to 3 business days. We ask that you follow-up with your pharmacy.  ?

## 2021-06-10 MED ORDER — METFORMIN HCL 500 MG PO TABS: 500.0000 mg | ORAL_TABLET | Freq: Two times a day (BID) | ORAL | 0 refills | Status: DC

## 2021-06-10 MED ORDER — AMLODIPINE BESYLATE 10 MG PO TABS: 10.0000 mg | ORAL_TABLET | Freq: Every day | ORAL | 0 refills | Status: DC

## 2021-06-10 NOTE — Addendum Note (Signed)
Addended by: Rae Mar on: 06/10/2021 09:16 AM ? ? Modules accepted: Orders ? ?

## 2021-06-15 ENCOUNTER — Other Ambulatory Visit: Payer: Self-pay | Admitting: Internal Medicine

## 2021-06-15 NOTE — Telephone Encounter (Signed)
Pt is requesting a short fill for spironolactone (ALDACTONE) 50 MG tablet enough to get her to her appt 4/18. ? ?Pharmacy: ?[id number] - [address]?

## 2021-06-18 ENCOUNTER — Other Ambulatory Visit: Payer: Self-pay

## 2021-06-18 MED ORDER — SPIRONOLACTONE 50 MG PO TABS: 50.0000 mg | ORAL_TABLET | Freq: Every day | ORAL | 3 refills | Status: DC

## 2021-07-13 ENCOUNTER — Telehealth: Payer: Self-pay

## 2021-07-13 ENCOUNTER — Encounter: Payer: BC Managed Care – PPO | Admitting: Internal Medicine

## 2021-07-13 DIAGNOSIS — Z0000 Encounter for general adult medical examination without abnormal findings: Secondary | ICD-10-CM

## 2021-07-13 DIAGNOSIS — E119 Type 2 diabetes mellitus without complications: Secondary | ICD-10-CM

## 2021-07-13 NOTE — Telephone Encounter (Signed)
Pt is requesting to have labs before CPE on 07/30/21. ? ?Please advise ?

## 2021-07-14 ENCOUNTER — Encounter: Payer: BC Managed Care – PPO | Admitting: Internal Medicine

## 2021-07-14 NOTE — Telephone Encounter (Signed)
Okay.  Thanks.

## 2021-07-27 ENCOUNTER — Other Ambulatory Visit (INDEPENDENT_AMBULATORY_CARE_PROVIDER_SITE_OTHER): Payer: BC Managed Care – PPO

## 2021-07-27 ENCOUNTER — Other Ambulatory Visit: Payer: BC Managed Care – PPO

## 2021-07-27 DIAGNOSIS — Z0000 Encounter for general adult medical examination without abnormal findings: Secondary | ICD-10-CM

## 2021-07-27 DIAGNOSIS — E119 Type 2 diabetes mellitus without complications: Secondary | ICD-10-CM

## 2021-07-27 LAB — CBC WITH DIFFERENTIAL/PLATELET
Basophils Absolute: 0 10*3/uL (ref 0.0–0.1)
Basophils Relative: 0.5 % (ref 0.0–3.0)
Eosinophils Absolute: 0.3 10*3/uL (ref 0.0–0.7)
Eosinophils Relative: 6.1 % — ABNORMAL HIGH (ref 0.0–5.0)
HCT: 39.4 % (ref 36.0–46.0)
Hemoglobin: 12.9 g/dL (ref 12.0–15.0)
Lymphocytes Relative: 27.5 % (ref 12.0–46.0)
Lymphs Abs: 1.4 10*3/uL (ref 0.7–4.0)
MCHC: 32.8 g/dL (ref 30.0–36.0)
MCV: 88.5 fl (ref 78.0–100.0)
Monocytes Absolute: 0.4 10*3/uL (ref 0.1–1.0)
Monocytes Relative: 7.8 % (ref 3.0–12.0)
Neutro Abs: 3 10*3/uL (ref 1.4–7.7)
Neutrophils Relative %: 58.1 % (ref 43.0–77.0)
Platelets: 304 10*3/uL (ref 150.0–400.0)
RBC: 4.46 Mil/uL (ref 3.87–5.11)
RDW: 13.8 % (ref 11.5–15.5)
WBC: 5.2 10*3/uL (ref 4.0–10.5)

## 2021-07-27 LAB — URINALYSIS
Bilirubin Urine: NEGATIVE
Hgb urine dipstick: NEGATIVE
Ketones, ur: NEGATIVE
Leukocytes,Ua: NEGATIVE
Nitrite: NEGATIVE
Specific Gravity, Urine: 1.015 (ref 1.000–1.030)
Total Protein, Urine: NEGATIVE
Urine Glucose: NEGATIVE
Urobilinogen, UA: 0.2 (ref 0.0–1.0)
pH: 6 (ref 5.0–8.0)

## 2021-07-27 LAB — COMPREHENSIVE METABOLIC PANEL
ALT: 14 U/L (ref 0–35)
AST: 20 U/L (ref 0–37)
Albumin: 4.6 g/dL (ref 3.5–5.2)
Alkaline Phosphatase: 70 U/L (ref 39–117)
BUN: 11 mg/dL (ref 6–23)
CO2: 26 mEq/L (ref 19–32)
Calcium: 9.8 mg/dL (ref 8.4–10.5)
Chloride: 104 mEq/L (ref 96–112)
Creatinine, Ser: 0.82 mg/dL (ref 0.40–1.20)
GFR: 77.94 mL/min (ref >60.00)
Glucose, Bld: 104 mg/dL — ABNORMAL HIGH (ref 70–99)
Potassium: 3.9 mEq/L (ref 3.5–5.1)
Sodium: 140 mEq/L (ref 135–145)
Total Bilirubin: 0.3 mg/dL (ref 0.2–1.2)
Total Protein: 8.1 g/dL (ref 6.0–8.3)

## 2021-07-27 LAB — LIPID PANEL
Cholesterol: 233 mg/dL — ABNORMAL HIGH (ref 0–200)
HDL: 56.6 mg/dL (ref >39.00)
LDL Cholesterol: 161 mg/dL — ABNORMAL HIGH (ref 0–99)
NonHDL: 176.26
Total CHOL/HDL Ratio: 4
Triglycerides: 74 mg/dL (ref 0.0–149.0)
VLDL: 14.8 mg/dL (ref 0.0–40.0)

## 2021-07-27 LAB — HEMOGLOBIN A1C: Hgb A1c MFr Bld: 6.7 % — ABNORMAL HIGH (ref 4.6–6.5)

## 2021-07-27 LAB — TSH: TSH: 0.97 u[IU]/mL (ref 0.35–5.50)

## 2021-07-30 ENCOUNTER — Ambulatory Visit (INDEPENDENT_AMBULATORY_CARE_PROVIDER_SITE_OTHER): Payer: BC Managed Care – PPO | Admitting: Internal Medicine

## 2021-07-30 ENCOUNTER — Encounter: Payer: Self-pay | Admitting: Internal Medicine

## 2021-07-30 VITALS — BP 124/78 | HR 72 | Temp 99.2°F | Ht 63.0 in | Wt 191.2 lb

## 2021-07-30 DIAGNOSIS — Z0000 Encounter for general adult medical examination without abnormal findings: Secondary | ICD-10-CM

## 2021-07-30 DIAGNOSIS — I10 Essential (primary) hypertension: Secondary | ICD-10-CM

## 2021-07-30 DIAGNOSIS — R739 Hyperglycemia, unspecified: Secondary | ICD-10-CM | POA: Diagnosis not present

## 2021-07-30 DIAGNOSIS — E119 Type 2 diabetes mellitus without complications: Secondary | ICD-10-CM

## 2021-07-30 DIAGNOSIS — E6609 Other obesity due to excess calories: Secondary | ICD-10-CM | POA: Diagnosis not present

## 2021-07-30 DIAGNOSIS — Z6831 Body mass index (BMI) 31.0-31.9, adult: Secondary | ICD-10-CM

## 2021-07-30 MED ORDER — RYBELSUS 3 MG PO TABS: 3.0000 mg | ORAL_TABLET | Freq: Every day | ORAL | 3 refills | Status: DC
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
## 2021-08-10 ENCOUNTER — Telehealth: Payer: Self-pay

## 2021-08-10 NOTE — Telephone Encounter (Signed)
PA STARTED.Marland Kitchen. ? ?Key: [id number] ?

## 2021-08-14 NOTE — Telephone Encounter (Signed)
Pt called to check status of PA. I advised pt that the PA was approved and to contact her pharmacy per MA

## 2021-08-24 NOTE — Assessment & Plan Note (Signed)
Start Rybelsus.  Diet and exercise

## 2021-08-24 NOTE — Assessment & Plan Note (Signed)
We discussed age appropriate health related issues, including available/recomended screening tests and vaccinations. We discussed a need for adhering to healthy diet and exercise. Labs were ordered to be later reviewed . All questions were answered. A cardiac CT scan for calcium scoring test was offered 2/21 Pap/mammogram with GYN Last colon 2015

## 2021-08-24 NOTE — Assessment & Plan Note (Signed)
Continue with amlodipine and spironolactone

## 2021-08-24 NOTE — Assessment & Plan Note (Deleted)
Prediabetes.  Start Rybelsus.  Diet and exercise

## 2021-08-24 NOTE — Assessment & Plan Note (Signed)
On diet.  Intermittent fasting.

## 2021-09-15 ENCOUNTER — Other Ambulatory Visit: Payer: Self-pay | Admitting: Internal Medicine

## 2021-09-23 DIAGNOSIS — H2513 Age-related nuclear cataract, bilateral: Secondary | ICD-10-CM | POA: Diagnosis not present

## 2021-09-23 DIAGNOSIS — H43813 Vitreous degeneration, bilateral: Secondary | ICD-10-CM | POA: Diagnosis not present

## 2021-09-23 DIAGNOSIS — E119 Type 2 diabetes mellitus without complications: Secondary | ICD-10-CM | POA: Diagnosis not present

## 2021-09-23 DIAGNOSIS — H25011 Cortical age-related cataract, right eye: Secondary | ICD-10-CM | POA: Diagnosis not present

## 2021-10-29 ENCOUNTER — Ambulatory Visit: Payer: BC Managed Care – PPO | Admitting: Internal Medicine

## 2021-11-23 ENCOUNTER — Encounter: Payer: Self-pay | Admitting: Internal Medicine

## 2021-11-23 ENCOUNTER — Other Ambulatory Visit (INDEPENDENT_AMBULATORY_CARE_PROVIDER_SITE_OTHER): Payer: BC Managed Care – PPO

## 2021-11-23 ENCOUNTER — Ambulatory Visit: Payer: BC Managed Care – PPO | Admitting: Internal Medicine

## 2021-11-23 VITALS — BP 136/78 | HR 80 | Temp 98.6°F | Ht 63.0 in | Wt 183.8 lb

## 2021-11-23 DIAGNOSIS — I10 Essential (primary) hypertension: Secondary | ICD-10-CM | POA: Diagnosis not present

## 2021-11-23 DIAGNOSIS — E785 Hyperlipidemia, unspecified: Secondary | ICD-10-CM | POA: Diagnosis not present

## 2021-11-23 DIAGNOSIS — E119 Type 2 diabetes mellitus without complications: Secondary | ICD-10-CM

## 2021-11-23 LAB — COMPREHENSIVE METABOLIC PANEL
ALT: 14 U/L (ref 0–35)
AST: 19 U/L (ref 0–37)
Albumin: 4.3 g/dL (ref 3.5–5.2)
Alkaline Phosphatase: 70 U/L (ref 39–117)
BUN: 10 mg/dL (ref 6–23)
CO2: 25 mEq/L (ref 19–32)
Calcium: 9.4 mg/dL (ref 8.4–10.5)
Chloride: 105 mEq/L (ref 96–112)
Creatinine, Ser: 0.87 mg/dL (ref 0.40–1.20)
GFR: 72.43 mL/min (ref >60.00)
Glucose, Bld: 93 mg/dL (ref 70–99)
Potassium: 3.7 mEq/L (ref 3.5–5.1)
Sodium: 139 mEq/L (ref 135–145)
Total Bilirubin: 0.4 mg/dL (ref 0.2–1.2)
Total Protein: 7.6 g/dL (ref 6.0–8.3)

## 2021-11-23 LAB — HEMOGLOBIN A1C: Hgb A1c MFr Bld: 6.4 % (ref 4.6–6.5)

## 2021-11-23 NOTE — Assessment & Plan Note (Signed)
Doing well Norvasc, Spironolactone
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
## 2021-11-23 NOTE — Assessment & Plan Note (Signed)
On Rybelsus 

## 2021-11-23 NOTE — Assessment & Plan Note (Signed)
Cont w/wt loss Check lipids

## 2021-12-19 ENCOUNTER — Other Ambulatory Visit: Payer: Self-pay | Admitting: Internal Medicine

## 2022-03-04 ENCOUNTER — Ambulatory Visit: Payer: BC Managed Care – PPO | Admitting: Internal Medicine

## 2022-04-07 ENCOUNTER — Other Ambulatory Visit (INDEPENDENT_AMBULATORY_CARE_PROVIDER_SITE_OTHER): Payer: BC Managed Care – PPO

## 2022-04-07 DIAGNOSIS — E785 Hyperlipidemia, unspecified: Secondary | ICD-10-CM | POA: Diagnosis not present

## 2022-04-07 DIAGNOSIS — I10 Essential (primary) hypertension: Secondary | ICD-10-CM | POA: Diagnosis not present

## 2022-04-07 DIAGNOSIS — E119 Type 2 diabetes mellitus without complications: Secondary | ICD-10-CM

## 2022-04-07 LAB — COMPREHENSIVE METABOLIC PANEL
ALT: 14 U/L (ref 0–35)
AST: 18 U/L (ref 0–37)
Albumin: 4.4 g/dL (ref 3.5–5.2)
Alkaline Phosphatase: 78 U/L (ref 39–117)
BUN: 15 mg/dL (ref 6–23)
CO2: 24 mEq/L (ref 19–32)
Calcium: 9.5 mg/dL (ref 8.4–10.5)
Chloride: 105 mEq/L (ref 96–112)
Creatinine, Ser: 0.86 mg/dL (ref 0.40–1.20)
GFR: 73.26 mL/min (ref >60.00)
Glucose, Bld: 95 mg/dL (ref 70–99)
Potassium: 3.9 mEq/L (ref 3.5–5.1)
Sodium: 141 mEq/L (ref 135–145)
Total Bilirubin: 0.4 mg/dL (ref 0.2–1.2)
Total Protein: 7.8 g/dL (ref 6.0–8.3)

## 2022-04-07 LAB — LIPID PANEL
Cholesterol: 216 mg/dL — ABNORMAL HIGH (ref 0–200)
HDL: 53.9 mg/dL (ref >39.00)
LDL Cholesterol: 143 mg/dL — ABNORMAL HIGH (ref 0–99)
NonHDL: 161.62
Total CHOL/HDL Ratio: 4
Triglycerides: 92 mg/dL (ref 0.0–149.0)
VLDL: 18.4 mg/dL (ref 0.0–40.0)

## 2022-04-07 LAB — HEMOGLOBIN A1C: Hgb A1c MFr Bld: 6.5 % (ref 4.6–6.5)

## 2022-04-08 ENCOUNTER — Encounter: Payer: Self-pay | Admitting: Internal Medicine

## 2022-04-08 ENCOUNTER — Ambulatory Visit: Payer: BC Managed Care – PPO | Admitting: Internal Medicine

## 2022-04-08 VITALS — BP 132/82 | HR 70 | Temp 98.1°F | Ht 63.0 in | Wt 183.0 lb

## 2022-04-08 DIAGNOSIS — R252 Cramp and spasm: Secondary | ICD-10-CM

## 2022-04-08 DIAGNOSIS — E785 Hyperlipidemia, unspecified: Secondary | ICD-10-CM | POA: Diagnosis not present

## 2022-04-08 DIAGNOSIS — Z23 Encounter for immunization: Secondary | ICD-10-CM

## 2022-04-08 DIAGNOSIS — E119 Type 2 diabetes mellitus without complications: Secondary | ICD-10-CM | POA: Diagnosis not present

## 2022-04-08 MED ORDER — RYBELSUS 7 MG PO TABS: 1.0000 | ORAL_TABLET | Freq: Every day | ORAL | 3 refills | Status: DC

## 2022-04-08 NOTE — Assessment & Plan Note (Signed)
  On diet  

## 2022-04-08 NOTE — Assessment & Plan Note (Signed)
Due to spironolactone? - rare Try Magnesium oil spray

## 2022-04-08 NOTE — Assessment & Plan Note (Addendum)
on Metformin Increase Rybelsus dose
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
## 2022-04-14 ENCOUNTER — Telehealth: Payer: Self-pay | Admitting: Internal Medicine

## 2022-04-14 MED ORDER — RYBELSUS 7 MG PO TABS: 1.0000 | ORAL_TABLET | Freq: Every day | ORAL | 3 refills | Status: DC

## 2022-04-14 NOTE — Telephone Encounter (Signed)
Patient called and stated that her prescription for semaglutide (rybelsus) '7mg'$  cannot be filled at her [facility] and she would like it sent to the [facility] on [facility] instead.

## 2022-04-14 NOTE — Telephone Encounter (Signed)
Resent rx to [facility].Marland KitchenJohny Harrison

## 2022-04-14 NOTE — Telephone Encounter (Signed)
Patient called stating Allison Harrison is saying we have to contact the insurance to authorize the medication.

## 2022-04-14 NOTE — Telephone Encounter (Signed)
Called pt LMOM will forward msg to PA team.. Once I hear back from them will let her know status.Marland KitchenJohny Chess

## 2022-04-15 ENCOUNTER — Other Ambulatory Visit (HOSPITAL_COMMUNITY): Payer: Self-pay

## 2022-04-20 ENCOUNTER — Telehealth: Payer: Self-pay

## 2022-04-20 ENCOUNTER — Other Ambulatory Visit (HOSPITAL_COMMUNITY): Payer: Self-pay

## 2022-04-20 NOTE — Telephone Encounter (Signed)
Pharmacy Patient Advocate Encounter   Received notification that prior authorization for Rybelsus '7mg'$  tabs is required/requested.  Per Test Claim: product/service exclusion non-formulary alternative   PA submitted on 04/20/22 to (ins) [employer] [place] via [email or web address]  Phone number: [phone] Status is pending

## 2022-04-26 ENCOUNTER — Other Ambulatory Visit (HOSPITAL_COMMUNITY): Payer: Self-pay

## 2022-04-26 NOTE — Telephone Encounter (Signed)
Patient Advocate Encounter  Prior Authorization for Rybelsus '7mg'$  has been approved.     Effective dates: 04/22/22 through 08/11/22

## 2022-04-28 ENCOUNTER — Other Ambulatory Visit (HOSPITAL_COMMUNITY): Payer: Self-pay

## 2022-04-28 NOTE — Telephone Encounter (Signed)
Notified pt w/PA status.Marland KitchenJohny Harrison

## 2022-06-10 ENCOUNTER — Other Ambulatory Visit: Payer: Self-pay | Admitting: Internal Medicine

## 2022-06-15 ENCOUNTER — Telehealth: Payer: Self-pay | Admitting: Internal Medicine

## 2022-06-15 MED ORDER — METFORMIN HCL 500 MG PO TABS: 500.0000 mg | ORAL_TABLET | Freq: Two times a day (BID) | ORAL | 1 refills | Status: DC

## 2022-06-15 NOTE — Telephone Encounter (Signed)
Refills sent [facility].Marland KitchenJohny Chess

## 2022-06-15 NOTE — Telephone Encounter (Signed)
Prescription Request  06/15/2022  LOV: 04/08/2022  What is the name of the medication or equipment? metFORMIN (GLUCOPHAGE) 500 MG tablet   Have you contacted your pharmacy to request a refill? No   Which pharmacy would you like this sent to?  [facility] ([place]), [place] - [address] [place] ([place]) [place] [address] Phone: [phone] Fax: [phone]    Patient notified that their request is being sent to the clinical staff for review and that they should receive a response within 2 business days.   Please advise at [place]   Patient did not contact pharmacy because she changed pharmacies.

## 2022-07-14 ENCOUNTER — Ambulatory Visit: Payer: BC Managed Care – PPO | Admitting: Internal Medicine

## 2022-08-04 ENCOUNTER — Other Ambulatory Visit (INDEPENDENT_AMBULATORY_CARE_PROVIDER_SITE_OTHER): Payer: BC Managed Care – PPO

## 2022-08-04 ENCOUNTER — Other Ambulatory Visit: Payer: BC Managed Care – PPO

## 2022-08-04 DIAGNOSIS — E119 Type 2 diabetes mellitus without complications: Secondary | ICD-10-CM | POA: Diagnosis not present

## 2022-08-04 LAB — COMPREHENSIVE METABOLIC PANEL
ALT: 13 U/L (ref 0–35)
AST: 20 U/L (ref 0–37)
Albumin: 4.4 g/dL (ref 3.5–5.2)
Alkaline Phosphatase: 65 U/L (ref 39–117)
BUN: 15 mg/dL (ref 6–23)
CO2: 25 mEq/L (ref 19–32)
Calcium: 9.6 mg/dL (ref 8.4–10.5)
Chloride: 104 mEq/L (ref 96–112)
Creatinine, Ser: 0.88 mg/dL (ref 0.40–1.20)
GFR: 71.1 mL/min (ref >60.00)
Glucose, Bld: 88 mg/dL (ref 70–99)
Potassium: 4.2 mEq/L (ref 3.5–5.1)
Sodium: 139 mEq/L (ref 135–145)
Total Bilirubin: 0.4 mg/dL (ref 0.2–1.2)
Total Protein: 7.8 g/dL (ref 6.0–8.3)

## 2022-08-04 LAB — HEMOGLOBIN A1C: Hgb A1c MFr Bld: 6.5 % (ref 4.6–6.5)

## 2022-08-05 ENCOUNTER — Ambulatory Visit: Payer: BC Managed Care – PPO | Admitting: Internal Medicine

## 2022-08-05 ENCOUNTER — Encounter: Payer: Self-pay | Admitting: Internal Medicine

## 2022-08-05 VITALS — BP 132/88 | HR 76 | Temp 98.8°F | Ht 63.0 in | Wt 183.0 lb

## 2022-08-05 DIAGNOSIS — E6609 Other obesity due to excess calories: Secondary | ICD-10-CM

## 2022-08-05 DIAGNOSIS — I10 Essential (primary) hypertension: Secondary | ICD-10-CM

## 2022-08-05 DIAGNOSIS — Z6831 Body mass index (BMI) 31.0-31.9, adult: Secondary | ICD-10-CM | POA: Diagnosis not present

## 2022-08-05 DIAGNOSIS — E119 Type 2 diabetes mellitus without complications: Secondary | ICD-10-CM | POA: Diagnosis not present

## 2022-08-05 DIAGNOSIS — Z7984 Long term (current) use of oral hypoglycemic drugs: Secondary | ICD-10-CM

## 2022-08-05 MED ORDER — RYBELSUS 14 MG PO TABS: 14.0000 mg | ORAL_TABLET | Freq: Every day | ORAL | 5 refills | Status: DC

## 2022-08-05 NOTE — Assessment & Plan Note (Signed)
Doing well Norvasc, Spironolactone 

## 2022-08-05 NOTE — Assessment & Plan Note (Addendum)
Moun[facility]o - not interested yet On Rybelsus - increase to 14 mg/d Try 18+6 diet

## 2022-08-05 NOTE — Assessment & Plan Note (Signed)
[facility] - not interested yet On Rybelsus - increase to 14 mg/d
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
## 2022-09-07 ENCOUNTER — Other Ambulatory Visit: Payer: Self-pay | Admitting: Internal Medicine

## 2022-10-27 ENCOUNTER — Encounter (INDEPENDENT_AMBULATORY_CARE_PROVIDER_SITE_OTHER): Payer: Self-pay

## 2022-11-05 ENCOUNTER — Encounter: Payer: Self-pay | Admitting: Internal Medicine

## 2022-11-05 ENCOUNTER — Ambulatory Visit: Payer: BC Managed Care – PPO | Admitting: Internal Medicine

## 2022-11-05 VITALS — BP 150/98 | HR 72 | Temp 98.4°F | Ht 63.0 in | Wt 179.0 lb

## 2022-11-05 DIAGNOSIS — J069 Acute upper respiratory infection, unspecified: Secondary | ICD-10-CM | POA: Diagnosis not present

## 2022-11-05 NOTE — Assessment & Plan Note (Signed)
Overall improving and symptoms are mild. POC covid-19 testing not done as she is almost out of window. Discussed otc products and asked to use only ones safe for high BP. Offered tessalon perles for cough but she declines. Work note done.

## 2022-11-05 NOTE — Progress Notes (Signed)
   Subjective:   Patient ID: Allison Harrison, female    DOB: 16-Aug-1960, 62 y.o.   MRN: [id number]  URI  Associated symptoms include congestion, coughing and rhinorrhea. Pertinent negatives include no ear pain, sinus pain, sneezing, sore throat or wheezing.   The patient is a 62 YO female coming in for cold symptoms 5-6 days. Overall improving. Unable to work this week due to symptoms. Did not take home covid-19 testing.   Review of Systems  Constitutional:  Positive for activity change and appetite change. Negative for chills, fatigue, fever and unexpected weight change.  HENT:  Positive for congestion, postnasal drip, rhinorrhea and sinus pressure. Negative for ear discharge, ear pain, sinus pain, sneezing, sore throat, tinnitus, trouble swallowing and voice change.   Eyes: Negative.   Respiratory:  Positive for cough. Negative for chest tightness, shortness of breath and wheezing.   Cardiovascular: Negative.   Gastrointestinal: Negative.   Musculoskeletal:  Positive for myalgias.  Neurological: Negative.     Objective:  Physical Exam Constitutional:      Appearance: She is well-developed.  HENT:     Head: Normocephalic and atraumatic.     Comments: Oropharynx with redness and clear drainage, nose with swollen turbinates, TMs normal bilaterally.  Neck:     Thyroid: No thyromegaly.  Cardiovascular:     Rate and Rhythm: Normal rate and regular rhythm.  Pulmonary:     Effort: Pulmonary effort is normal. No respiratory distress.     Breath sounds: Normal breath sounds. No wheezing or rales.  Abdominal:     Palpations: Abdomen is soft.  Musculoskeletal:        General: Tenderness present.     Cervical back: Normal range of motion.  Lymphadenopathy:     Cervical: No cervical adenopathy.  Skin:    General: Skin is warm and dry.  Neurological:     Mental Status: She is alert and oriented to person, place, and time.     Vitals:   11/05/22 1025  BP: (!) 150/98  Pulse: 72   Temp: 98.4 F (36.9 C)  TempSrc: Oral  SpO2: 98%  Weight: 179 lb (81.2 kg)  Height: 5\' 3"  (1.6 m)    Assessment & Plan:  Visit time 18 minutes in face to face communication with patient and coordination of care, additional 5 minutes spent in record review, coordination or care, ordering tests, communicating/referring to other healthcare professionals, documenting in medical records all on the same day of the visit for total time 23 minutes spent on the visit.

## 2022-11-17 ENCOUNTER — Other Ambulatory Visit (INDEPENDENT_AMBULATORY_CARE_PROVIDER_SITE_OTHER): Payer: BC Managed Care – PPO

## 2022-11-17 DIAGNOSIS — E119 Type 2 diabetes mellitus without complications: Secondary | ICD-10-CM

## 2022-11-17 LAB — COMPREHENSIVE METABOLIC PANEL
ALT: 76 U/L — ABNORMAL HIGH (ref 0–35)
AST: 78 U/L — ABNORMAL HIGH (ref 0–37)
Albumin: 4.1 g/dL (ref 3.5–5.2)
Alkaline Phosphatase: 60 U/L (ref 39–117)
BUN: 11 mg/dL (ref 6–23)
CO2: 27 mEq/L (ref 19–32)
Calcium: 9.3 mg/dL (ref 8.4–10.5)
Chloride: 105 mEq/L (ref 96–112)
Creatinine, Ser: 0.78 mg/dL (ref 0.40–1.20)
GFR: 82.01 mL/min (ref >60.00)
Glucose, Bld: 92 mg/dL (ref 70–99)
Potassium: 4.1 mEq/L (ref 3.5–5.1)
Sodium: 140 mEq/L (ref 135–145)
Total Bilirubin: 0.4 mg/dL (ref 0.2–1.2)
Total Protein: 7.4 g/dL (ref 6.0–8.3)

## 2022-11-17 LAB — BASIC METABOLIC PANEL
BUN: 11 mg/dL (ref 6–23)
CO2: 27 mEq/L (ref 19–32)
Calcium: 9.3 mg/dL (ref 8.4–10.5)
Chloride: 105 mEq/L (ref 96–112)
Creatinine, Ser: 0.78 mg/dL (ref 0.40–1.20)
GFR: 82.01 mL/min (ref >60.00)
Glucose, Bld: 92 mg/dL (ref 70–99)
Potassium: 4.1 mEq/L (ref 3.5–5.1)
Sodium: 140 mEq/L (ref 135–145)

## 2022-11-17 LAB — HEMOGLOBIN A1C: Hgb A1c MFr Bld: 6.4 % (ref 4.6–6.5)

## 2022-11-18 ENCOUNTER — Ambulatory Visit (INDEPENDENT_AMBULATORY_CARE_PROVIDER_SITE_OTHER): Payer: BC Managed Care – PPO | Admitting: Internal Medicine

## 2022-11-18 ENCOUNTER — Encounter: Payer: Self-pay | Admitting: Internal Medicine

## 2022-11-18 VITALS — BP 130/78 | HR 75 | Temp 98.0°F | Ht 63.0 in | Wt 183.0 lb

## 2022-11-18 DIAGNOSIS — E119 Type 2 diabetes mellitus without complications: Secondary | ICD-10-CM

## 2022-11-18 DIAGNOSIS — R7989 Other specified abnormal findings of blood chemistry: Secondary | ICD-10-CM | POA: Diagnosis not present

## 2022-11-18 DIAGNOSIS — Z0000 Encounter for general adult medical examination without abnormal findings: Secondary | ICD-10-CM | POA: Diagnosis not present

## 2022-11-18 DIAGNOSIS — E6609 Other obesity due to excess calories: Secondary | ICD-10-CM | POA: Diagnosis not present

## 2022-11-18 DIAGNOSIS — Z6831 Body mass index (BMI) 31.0-31.9, adult: Secondary | ICD-10-CM

## 2022-11-18 MED ORDER — OZEMPIC (0.25 OR 0.5 MG/DOSE) 2 MG/3ML ~~LOC~~ SOPN
PEN_INJECTOR | SUBCUTANEOUS | 1 refills | Status: DC
Start: 2022-11-18 — End: 2023-02-16

## 2022-11-18 NOTE — Assessment & Plan Note (Signed)
Mild S/p recent URI - pt took Tylenol Check [place]

## 2022-11-18 NOTE — Assessment & Plan Note (Addendum)
  On Rybelsus - 14 mg/d -d/c Start Ozempic

## 2022-11-18 NOTE — Assessment & Plan Note (Signed)
We discussed age appropriate health related issues, including available/recomended screening tests and vaccinations. We discussed a need for adhering to healthy diet and exercise. Labs were ordered to be later reviewed . All questions were answered. A cardiac CT scan for calcium scoring test was offered 2/21 Pap/mammogram with GYN Last colon 2015
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
## 2022-11-18 NOTE — Assessment & Plan Note (Signed)
Wt Readings from Last 3 Encounters:  11/18/22 183 lb (83 kg)  11/05/22 179 lb (81.2 kg)  08/05/22 183 lb (83 kg)

## 2022-12-08 ENCOUNTER — Other Ambulatory Visit: Payer: Self-pay

## 2022-12-08 ENCOUNTER — Telehealth: Payer: Self-pay | Admitting: Internal Medicine

## 2022-12-08 MED ORDER — SPIRONOLACTONE 50 MG PO TABS: 50.0000 mg | ORAL_TABLET | Freq: Every day | ORAL | 3 refills | Status: DC

## 2022-12-08 MED ORDER — AMLODIPINE BESYLATE 10 MG PO TABS: 10.0000 mg | ORAL_TABLET | Freq: Every day | ORAL | 3 refills | Status: DC

## 2022-12-08 MED ORDER — METFORMIN HCL 500 MG PO TABS: 500.0000 mg | ORAL_TABLET | Freq: Two times a day (BID) | ORAL | 1 refills | Status: DC

## 2022-12-08 NOTE — Telephone Encounter (Signed)
Prescription Request  12/08/2022  LOV: 11/18/2022  What is the name of the medication or equipment?  metFORMIN (GLUCOPHAGE) 500 MG tablet  amLODipine (NORVASC) 10 MG tablet  spironolactone (ALDACTONE) 50 MG tablet  Have you contacted your pharmacy to request a refill? No   Which pharmacy would you like this sent to?    [id number] - [place] ([place]), [place] - [address]  ([place]) [place] [address] Phone: [phone] Fax: [phone] Patient notified that their request is being sent to the clinical staff for review and that they should receive a response within 2 business days.   Please advise at Mobile [phone] (mobile)

## 2023-02-15 ENCOUNTER — Other Ambulatory Visit (INDEPENDENT_AMBULATORY_CARE_PROVIDER_SITE_OTHER): Payer: BC Managed Care – PPO

## 2023-02-15 DIAGNOSIS — R7989 Other specified abnormal findings of blood chemistry: Secondary | ICD-10-CM

## 2023-02-15 DIAGNOSIS — E119 Type 2 diabetes mellitus without complications: Secondary | ICD-10-CM | POA: Diagnosis not present

## 2023-02-15 LAB — COMPREHENSIVE METABOLIC PANEL
ALT: 13 U/L (ref 0–35)
AST: 19 U/L (ref 0–37)
Albumin: 4.3 g/dL (ref 3.5–5.2)
Alkaline Phosphatase: 66 U/L (ref 39–117)
BUN: 16 mg/dL (ref 6–23)
CO2: 24 meq/L (ref 19–32)
Calcium: 9.2 mg/dL (ref 8.4–10.5)
Chloride: 107 meq/L (ref 96–112)
Creatinine, Ser: 0.75 mg/dL (ref 0.40–1.20)
GFR: 85.81 mL/min (ref >60.00)
Glucose, Bld: 96 mg/dL (ref 70–99)
Potassium: 4 meq/L (ref 3.5–5.1)
Sodium: 139 meq/L (ref 135–145)
Total Bilirubin: 0.4 mg/dL (ref 0.2–1.2)
Total Protein: 7.5 g/dL (ref 6.0–8.3)

## 2023-02-15 LAB — HEMOGLOBIN A1C: Hgb A1c MFr Bld: 6.4 % (ref 4.6–6.5)

## 2023-02-16 ENCOUNTER — Ambulatory Visit: Payer: BC Managed Care – PPO | Admitting: Internal Medicine

## 2023-02-16 ENCOUNTER — Encounter: Payer: Self-pay | Admitting: Internal Medicine

## 2023-02-16 VITALS — BP 140/80 | HR 64 | Temp 98.1°F | Ht 63.0 in | Wt 178.8 lb

## 2023-02-16 DIAGNOSIS — I10 Essential (primary) hypertension: Secondary | ICD-10-CM

## 2023-02-16 DIAGNOSIS — R7989 Other specified abnormal findings of blood chemistry: Secondary | ICD-10-CM

## 2023-02-16 DIAGNOSIS — E876 Hypokalemia: Secondary | ICD-10-CM

## 2023-02-16 DIAGNOSIS — Z23 Encounter for immunization: Secondary | ICD-10-CM

## 2023-02-16 DIAGNOSIS — E119 Type 2 diabetes mellitus without complications: Secondary | ICD-10-CM

## 2023-02-16 DIAGNOSIS — Z7985 Long-term (current) use of injectable non-insulin antidiabetic drugs: Secondary | ICD-10-CM

## 2023-02-16 MED ORDER — SEMAGLUTIDE (1 MG/DOSE) 4 MG/3ML ~~LOC~~ SOPN: 1.0000 mg | PEN_INJECTOR | SUBCUTANEOUS | 3 refills | Status: DC

## 2023-02-16 NOTE — Assessment & Plan Note (Signed)
D/c HCTZ Start Maxzide KCl x 1 mo

## 2023-02-16 NOTE — Assessment & Plan Note (Signed)
Continue on Ozempic Titrating up to 1 mg/d

## 2023-02-16 NOTE — Assessment & Plan Note (Signed)
Doing well Norvasc, Spironolactone

## 2023-02-16 NOTE — Assessment & Plan Note (Signed)
LFTs normalized on Ozempic  Abd [place] pending
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
## 2023-04-28 ENCOUNTER — Telehealth: Payer: Self-pay

## 2023-04-28 NOTE — Telephone Encounter (Signed)
Copied from CRM [id number]. Topic: Clinical - Prescription Issue >> Apr 28, 2023  3:08 PM Allison Harrison wrote:  Reason for CRM: pt called and stated that her pharmacy at [facility] has sent a prior authorization request to the office because she is needing a refill on her Ozempic but there was a change in the dose. Please call and advise.

## 2023-04-29 ENCOUNTER — Other Ambulatory Visit (HOSPITAL_COMMUNITY): Payer: Self-pay

## 2023-04-29 ENCOUNTER — Telehealth: Payer: Self-pay

## 2023-04-29 NOTE — Telephone Encounter (Signed)
Pharmacy Patient Advocate Encounter   Received notification from Pt Calls Messages that prior authorization for Ozempic 4mg /63ml is required/requested.   Insurance verification completed.   The patient is insured through  [place]  .   Per test claim: The current 28 day co-pay is, $24.99.  No PA needed at this time. This test claim was processed through [facility]- copay amounts may vary at other pharmacies due to pharmacy/plan contracts, or as the patient moves through the different stages of their insurance plan.   The insurance plan would not cover a 3 month supply.

## 2023-05-02 ENCOUNTER — Other Ambulatory Visit (HOSPITAL_COMMUNITY): Payer: Self-pay

## 2023-05-02 ENCOUNTER — Telehealth: Payer: Self-pay

## 2023-05-02 NOTE — Telephone Encounter (Signed)
Tried reaching pt to inform her of the following per the PA team "Good afternoon. The Ozempic didn't need a prior authorization. Her insurance would only allow one month at a time to be filled. The pharmacy is working on getting it ready for her."  Please inform pt of above information if she called back.

## 2023-05-02 NOTE — Telephone Encounter (Signed)
Copied from CRM [id number]. Topic: Clinical - Prescription Issue >> May 02, 2023 10:47 AM Allison Harrison wrote: Reason for CRM:  pt called to states she is unable to get Semaglutide, 1 MG/DOSE, 4 MG/3ML SOPN. Pt states the pharmacy is requesting an Authorization. Pt state she was supposed to take the medication on Sunday was unable to , The Pharmacy will not Fill the medication without an authorization. Please reach out to pt's insurance to obtain and authorization for the medication. Please give pt a call back [phone]

## 2023-05-02 NOTE — Telephone Encounter (Signed)
Pharmacy Patient Advocate Encounter   Received notification from Pt Calls Messages that prior authorization for Ozempic  is required/requested.   Insurance verification completed.   The patient is insured through [place]    .   Per test claim: The current 28 day co-pay is, $24.99.  No PA needed at this time. This test claim was processed through [facility]- copay amounts may vary at other pharmacies due to pharmacy/plan contracts, or as the patient moves through the different stages of their insurance plan.     Placed a call to [facility] and per the pharmacist, she stated everything had been transferred out to [facility].   Placed a call to [facility], spoke with the pharmacist and asked her to fill the Ozempic for a 1 month supply. Received a paid claim for $24.99 and she said she would get it ready.

## 2023-05-19 ENCOUNTER — Ambulatory Visit: Payer: BC Managed Care – PPO | Admitting: Internal Medicine

## 2023-06-15 ENCOUNTER — Other Ambulatory Visit (INDEPENDENT_AMBULATORY_CARE_PROVIDER_SITE_OTHER)

## 2023-06-15 DIAGNOSIS — E119 Type 2 diabetes mellitus without complications: Secondary | ICD-10-CM

## 2023-06-15 DIAGNOSIS — E876 Hypokalemia: Secondary | ICD-10-CM | POA: Diagnosis not present

## 2023-06-15 LAB — COMPREHENSIVE METABOLIC PANEL
ALT: 14 U/L (ref 0–35)
AST: 20 U/L (ref 0–37)
Albumin: 4.5 g/dL (ref 3.5–5.2)
Alkaline Phosphatase: 70 U/L (ref 39–117)
BUN: 14 mg/dL (ref 6–23)
CO2: 28 meq/L (ref 19–32)
Calcium: 9.8 mg/dL (ref 8.4–10.5)
Chloride: 103 meq/L (ref 96–112)
Creatinine, Ser: 0.76 mg/dL (ref 0.40–1.20)
GFR: 84.26 mL/min (ref >60.00)
Glucose, Bld: 90 mg/dL (ref 70–99)
Potassium: 3.9 meq/L (ref 3.5–5.1)
Sodium: 139 meq/L (ref 135–145)
Total Bilirubin: 0.4 mg/dL (ref 0.2–1.2)
Total Protein: 7.9 g/dL (ref 6.0–8.3)

## 2023-06-15 LAB — HEMOGLOBIN A1C: Hgb A1c MFr Bld: 6.4 % (ref 4.6–6.5)

## 2023-06-16 ENCOUNTER — Ambulatory Visit: Payer: BC Managed Care – PPO | Admitting: Internal Medicine

## 2023-06-16 ENCOUNTER — Encounter: Payer: Self-pay | Admitting: Internal Medicine

## 2023-06-16 VITALS — BP 114/74 | HR 68 | Temp 98.1°F | Ht 63.0 in | Wt 180.0 lb

## 2023-06-16 DIAGNOSIS — Z6831 Body mass index (BMI) 31.0-31.9, adult: Secondary | ICD-10-CM | POA: Diagnosis not present

## 2023-06-16 DIAGNOSIS — E66811 Obesity, class 1: Secondary | ICD-10-CM

## 2023-06-16 DIAGNOSIS — I10 Essential (primary) hypertension: Secondary | ICD-10-CM

## 2023-06-16 DIAGNOSIS — K5904 Chronic idiopathic constipation: Secondary | ICD-10-CM

## 2023-06-16 DIAGNOSIS — E785 Hyperlipidemia, unspecified: Secondary | ICD-10-CM

## 2023-06-16 DIAGNOSIS — Z0000 Encounter for general adult medical examination without abnormal findings: Secondary | ICD-10-CM

## 2023-06-16 DIAGNOSIS — E6609 Other obesity due to excess calories: Secondary | ICD-10-CM

## 2023-06-16 DIAGNOSIS — E119 Type 2 diabetes mellitus without complications: Secondary | ICD-10-CM

## 2023-06-16 DIAGNOSIS — K5900 Constipation, unspecified: Secondary | ICD-10-CM | POA: Insufficient documentation

## 2023-06-16 DIAGNOSIS — Z7985 Long-term (current) use of injectable non-insulin antidiabetic drugs: Secondary | ICD-10-CM

## 2023-06-16 MED ORDER — SENNA-DOCUSATE SODIUM 8.6-50 MG PO TABS: 1.0000 | ORAL_TABLET | Freq: Every day | ORAL | 3 refills | Status: AC | PRN

## 2023-06-16 MED ORDER — POLYETHYLENE GLYCOL 3350 17 GM/SCOOP PO POWD: 17.0000 g | Freq: Two times a day (BID) | ORAL | 3 refills | Status: AC | PRN

## 2023-06-16 NOTE — Assessment & Plan Note (Signed)
We discussed age appropriate health related issues, including available/recomended screening tests and vaccinations. We discussed a need for adhering to healthy diet and exercise. Labs were ordered to be later reviewed . All questions were answered. A cardiac CT scan for calcium scoring test was offered 2/21 Pap/mammogram with GYN Last colon 2015

## 2023-06-16 NOTE — Assessment & Plan Note (Signed)
 Pericolace, Miralax prn

## 2023-06-16 NOTE — Assessment & Plan Note (Signed)
 Better according to home wts

## 2023-06-16 NOTE — Assessment & Plan Note (Signed)
 On diet.  The patient is reluctant to take a lipid-lowering agent (statin) Cardiac coronary calcium CT score test was advised.  Info provided again

## 2023-06-16 NOTE — Assessment & Plan Note (Signed)
 Continue on Ozempic Titrating up to 1 mg/d

## 2023-06-16 NOTE — Assessment & Plan Note (Signed)
Doing well Norvasc, Spironolactone
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
## 2023-09-21 ENCOUNTER — Ambulatory Visit: Admitting: Internal Medicine

## 2023-10-19 ENCOUNTER — Ambulatory Visit: Admitting: Internal Medicine

## 2023-12-16 ENCOUNTER — Other Ambulatory Visit: Payer: Self-pay | Admitting: Internal Medicine

## 2023-12-28 ENCOUNTER — Other Ambulatory Visit (INDEPENDENT_AMBULATORY_CARE_PROVIDER_SITE_OTHER)

## 2023-12-28 ENCOUNTER — Ambulatory Visit: Admitting: Internal Medicine

## 2023-12-28 ENCOUNTER — Encounter: Payer: Self-pay | Admitting: Internal Medicine

## 2023-12-28 VITALS — BP 134/84 | HR 65 | Temp 98.1°F | Ht 63.0 in | Wt 181.2 lb

## 2023-12-28 DIAGNOSIS — Z0000 Encounter for general adult medical examination without abnormal findings: Secondary | ICD-10-CM | POA: Diagnosis not present

## 2023-12-28 DIAGNOSIS — I10 Essential (primary) hypertension: Secondary | ICD-10-CM

## 2023-12-28 DIAGNOSIS — E119 Type 2 diabetes mellitus without complications: Secondary | ICD-10-CM

## 2023-12-28 DIAGNOSIS — Z23 Encounter for immunization: Secondary | ICD-10-CM

## 2023-12-28 DIAGNOSIS — Z7985 Long-term (current) use of injectable non-insulin antidiabetic drugs: Secondary | ICD-10-CM | POA: Diagnosis not present

## 2023-12-28 DIAGNOSIS — E66811 Obesity, class 1: Secondary | ICD-10-CM

## 2023-12-28 DIAGNOSIS — E6609 Other obesity due to excess calories: Secondary | ICD-10-CM

## 2023-12-28 DIAGNOSIS — Z6831 Body mass index (BMI) 31.0-31.9, adult: Secondary | ICD-10-CM

## 2023-12-28 LAB — COMPREHENSIVE METABOLIC PANEL WITH GFR
ALT: 15 U/L (ref 0–35)
AST: 22 U/L (ref 0–37)
Albumin: 4.5 g/dL (ref 3.5–5.2)
Alkaline Phosphatase: 63 U/L (ref 39–117)
BUN: 13 mg/dL (ref 6–23)
CO2: 28 meq/L (ref 19–32)
Calcium: 9.9 mg/dL (ref 8.4–10.5)
Chloride: 104 meq/L (ref 96–112)
Creatinine, Ser: 0.83 mg/dL (ref 0.40–1.20)
GFR: 75.52 mL/min (ref >60.00)
Glucose, Bld: 82 mg/dL (ref 70–99)
Potassium: 4 meq/L (ref 3.5–5.1)
Sodium: 139 meq/L (ref 135–145)
Total Bilirubin: 0.3 mg/dL (ref 0.2–1.2)
Total Protein: 8 g/dL (ref 6.0–8.3)

## 2023-12-28 LAB — HEMOGLOBIN A1C: Hgb A1c MFr Bld: 6.5 % (ref 4.6–6.5)

## 2023-12-28 MED ORDER — TIRZEPATIDE 5 MG/0.5ML ~~LOC~~ SOAJ: 5.0000 mg | SUBCUTANEOUS | 3 refills | Status: AC

## 2023-12-28 MED ORDER — METFORMIN HCL 500 MG PO TABS: 500.0000 mg | ORAL_TABLET | Freq: Two times a day (BID) | ORAL | 1 refills | Status: AC

## 2023-12-28 NOTE — Assessment & Plan Note (Signed)
 D/c Ozempic  Start Mounjaro  in 4 weeks

## 2023-12-28 NOTE — Assessment & Plan Note (Signed)
 See plan

## 2023-12-28 NOTE — Assessment & Plan Note (Signed)
Doing well Norvasc, Spironolactone
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
## 2023-12-29 ENCOUNTER — Ambulatory Visit: Payer: Self-pay | Admitting: Internal Medicine

## 2024-01-01 NOTE — Assessment & Plan Note (Signed)
 We discussed age appropriate health related issues, including available/recomended screening tests and vaccinations. We discussed a need for adhering to healthy diet and exercise. Labs were ordered to be later reviewed . All questions were answered. A cardiac CT scan for calcium scoring test was offered 2/21 Last colon 2015, due colonoscopy in 2025.  GI referral was offered.  Cologuard option was discussed Pap/mammogram with GYN

## 2024-01-05 DIAGNOSIS — E119 Type 2 diabetes mellitus without complications: Secondary | ICD-10-CM | POA: Diagnosis not present

## 2024-01-05 DIAGNOSIS — H2513 Age-related nuclear cataract, bilateral: Secondary | ICD-10-CM | POA: Diagnosis not present

## 2024-01-05 LAB — OPHTHALMOLOGY REPORT-SCANNED

## 2024-03-20 ENCOUNTER — Other Ambulatory Visit: Payer: Self-pay | Admitting: Internal Medicine

## 2024-04-03 ENCOUNTER — Ambulatory Visit: Admitting: Internal Medicine

## 2024-05-04 ENCOUNTER — Encounter: Payer: Self-pay | Admitting: *Deleted

## 2024-05-04 NOTE — Progress Notes (Signed)
 Allison Harrison                                          MRN: [id number]   05/04/2024   The [facility] Quality Team Specialist reviewed this patient medical record for the purposes of chart review for care gap closure. The following were reviewed: chart review for care gap closure-kidney health evaluation for diabetes:eGFR  and uACR.    [facility] Quality Team

## 2024-05-16 ENCOUNTER — Ambulatory Visit: Admitting: Internal Medicine
